# Patient Record
Sex: Female | Born: 1985 | Race: White | Hispanic: No | Smoking: Former smoker
Health system: Southern US, Community
[De-identification: ages and names within clinical notes are randomized; demographics above are authoritative.]

## PROBLEM LIST (undated history)

## (undated) ENCOUNTER — Inpatient Hospital Stay (HOSPITAL_COMMUNITY): Payer: Self-pay

## (undated) DIAGNOSIS — R112 Nausea with vomiting, unspecified: Secondary | ICD-10-CM

## (undated) DIAGNOSIS — Z9889 Other specified postprocedural states: Secondary | ICD-10-CM

## (undated) DIAGNOSIS — R519 Headache, unspecified: Secondary | ICD-10-CM

## (undated) DIAGNOSIS — K9041 Non-celiac gluten sensitivity: Secondary | ICD-10-CM

## (undated) DIAGNOSIS — F32A Depression, unspecified: Secondary | ICD-10-CM

## (undated) DIAGNOSIS — E031 Congenital hypothyroidism without goiter: Secondary | ICD-10-CM

## (undated) HISTORY — PX: CHOLECYSTECTOMY: SHX55

## (undated) HISTORY — PX: ABDOMINAL HERNIA REPAIR: SHX539

---

## 2013-09-03 HISTORY — PX: OOPHORECTOMY: SHX86

## 2013-12-04 NOTE — L&D Delivery Note (Signed)
Delivery Note At 8:45 PM a viable female was delivered via Vaginal, Spontaneous Delivery (Presentation: Left Occiput Anterior).  APGAR: 9, 9; weight .   Placenta status: Intact, Spontaneous.  Cord: 3 vessels with the following complications: 1 loose nuchal which baby was delivered through.  Cord pH: pending  Anesthesia: None  Episiotomy: None Lacerations: None Suture Repair: NA Est. Blood Loss (mL): 200  Mom to postpartum.  Baby to Couplet care / Skin to Skin.  Joanna Puff 08/25/2014, 9:39 PM Evaluation and management procedures were performed by Resident physician under my supervision/collaboration. Chart reviewed, patient examined by me and I agree with management and plan.  Danae Orleans, CNM 08/25/2014 10:26 PM

## 2014-01-23 LAB — OB RESULTS CONSOLE GC/CHLAMYDIA: GC PROBE AMP, GENITAL: NEGATIVE

## 2014-01-23 LAB — OB RESULTS CONSOLE HIV ANTIBODY (ROUTINE TESTING): HIV: NONREACTIVE

## 2014-02-03 LAB — OB RESULTS CONSOLE RPR: RPR: NONREACTIVE

## 2014-02-03 LAB — OB RESULTS CONSOLE HEPATITIS B SURFACE ANTIGEN: HEP B S AG: NEGATIVE

## 2014-02-03 LAB — OB RESULTS CONSOLE ABO/RH: RH Type: NEGATIVE

## 2014-02-03 LAB — OB RESULTS CONSOLE ANTIBODY SCREEN: ANTIBODY SCREEN: NEGATIVE

## 2014-02-03 LAB — OB RESULTS CONSOLE RUBELLA ANTIBODY, IGM: Rubella: IMMUNE

## 2014-06-10 ENCOUNTER — Inpatient Hospital Stay (HOSPITAL_COMMUNITY)
Admission: AD | Admit: 2014-06-10 | Discharge: 2014-06-10 | Disposition: A | Discharge status: Home / Self Care | Payer: Medicaid - Out of State | Source: Ambulatory Visit | Attending: Obstetrics & Gynecology | Admitting: Obstetrics & Gynecology

## 2014-06-10 ENCOUNTER — Encounter (HOSPITAL_COMMUNITY): Payer: Self-pay

## 2014-06-10 ENCOUNTER — Inpatient Hospital Stay (HOSPITAL_COMMUNITY): Payer: Medicaid - Out of State

## 2014-06-10 DIAGNOSIS — Z87891 Personal history of nicotine dependence: Secondary | ICD-10-CM | POA: Diagnosis not present

## 2014-06-10 DIAGNOSIS — O99280 Endocrine, nutritional and metabolic diseases complicating pregnancy, unspecified trimester: Principal | ICD-10-CM

## 2014-06-10 DIAGNOSIS — O26899 Other specified pregnancy related conditions, unspecified trimester: Secondary | ICD-10-CM | POA: Diagnosis present

## 2014-06-10 DIAGNOSIS — Z880 Allergy status to penicillin: Secondary | ICD-10-CM | POA: Diagnosis not present

## 2014-06-10 DIAGNOSIS — O360990 Maternal care for other rhesus isoimmunization, unspecified trimester, not applicable or unspecified: Secondary | ICD-10-CM | POA: Diagnosis not present

## 2014-06-10 DIAGNOSIS — O9989 Other specified diseases and conditions complicating pregnancy, childbirth and the puerperium: Secondary | ICD-10-CM

## 2014-06-10 DIAGNOSIS — E079 Disorder of thyroid, unspecified: Secondary | ICD-10-CM | POA: Insufficient documentation

## 2014-06-10 DIAGNOSIS — E031 Congenital hypothyroidism without goiter: Secondary | ICD-10-CM | POA: Diagnosis not present

## 2014-06-10 DIAGNOSIS — O0933 Supervision of pregnancy with insufficient antenatal care, third trimester: Secondary | ICD-10-CM

## 2014-06-10 DIAGNOSIS — R109 Unspecified abdominal pain: Secondary | ICD-10-CM | POA: Diagnosis present

## 2014-06-10 HISTORY — DX: Congenital hypothyroidism without goiter: E03.1

## 2014-06-10 LAB — URINALYSIS, ROUTINE W REFLEX MICROSCOPIC
Bilirubin Urine: NEGATIVE
GLUCOSE, UA: NEGATIVE mg/dL
Hgb urine dipstick: NEGATIVE
Ketones, ur: NEGATIVE mg/dL
LEUKOCYTES UA: NEGATIVE
NITRITE: NEGATIVE
Protein, ur: NEGATIVE mg/dL
Specific Gravity, Urine: 1.015 (ref 1.005–1.030)
Urobilinogen, UA: 0.2 mg/dL (ref 0.0–1.0)
pH: 7.5 (ref 5.0–8.0)

## 2014-06-10 NOTE — MAU Provider Note (Signed)
None     Chief Complaint:  Abdominal Pain   Paula MoodMorgan Nickell is  28 y.o. G3P2002 at 2210w0d presents complaining of Abdominal Pain .  She states none contractions are associated with none vaginal bleeding, intact membranes, along with active fetal movement. Pt. Presents with ongoing left sided abdominal pain that is intermittent, sharp, and diffusely radiating. She believes the pain may be a hernia. She has a hx of inguinal hernia x2 repaired in 4th grade. She says that she felt a "lump" on the L side of her umbilicus and worsening periodic pain x 6 weeks. She says that the pain is worse when lifting her 7217 month old son, with raising her arms, and periodically whenever she stumbles. She denies nausea, vomiting, fever, or chills. She is passing gas and having BM. She denies dysuria, hematuria, or flank pain. She says that she has not had any trouble urinating, and has had no hematuria. She says that the pain is random and is not worse when she eats. She denies trauma. She has +FM. She denies bleeding, contractions, or LOF.   Obstetrical/Gynecological History: OB History   Grav Para Term Preterm Abortions TAB SAB Ect Mult Living   3 2 2       2      Past Medical History: Past Medical History  Diagnosis Date  . Congenital hypothyroidism     Past Surgical History: Past Surgical History  Procedure Laterality Date  . Abdominal hernia repair    . Oophorectomy Right     Family History: History reviewed. No pertinent family history.  Social History: History  Substance Use Topics  . Smoking status: Former Smoker    Types: Cigarettes  . Smokeless tobacco: Not on file  . Alcohol Use: No    Allergies:  Allergies  Allergen Reactions  . Penicillins Other (See Comments)    hallucinations  . Sulfa Antibiotics Other (See Comments)    hallucinations    Meds:  Prescriptions prior to admission  Medication Sig Dispense Refill  . folic acid (FOLVITE) 400 MCG tablet Take 400 mcg by mouth  daily.      Marland Kitchen. levothyroxine (SYNTHROID, LEVOTHROID) 200 MCG tablet Take 200 mcg by mouth daily before breakfast.      . Pediatric Multiple Vit-C-FA (PEDIATRIC MULTIVITAMIN) chewable tablet Chew 2 tablets by mouth daily.        Review of Systems -  Per HPI above.     Physical Exam  Blood pressure 118/69, pulse 84, temperature 98.2 F (36.8 C), temperature source Oral, resp. rate 16, height 5' 2.5" (1.588 m), weight 61.598 kg (135 lb 12.8 oz), SpO2 99.00%. GENERAL: Well-developed, well-nourished female in no acute distress.  LUNGS: Clear to auscultation bilaterally.  HEART: Regular rate and rhythm. ABDOMEN: Soft, Mildly tender to palpation to LUQ over what appears to be small ventral hernia, nondistended, gravid.  EXTREMITIES: Nontender, no edema, 2+ distal pulses. Neurologically Grossly intact CERVICAL EXAM: Dilatation 0cm   Effacement 0%   Station High   Presentation: cephalic FHT:  Baseline rate 130's bpm   Variability moderate  Accelerations present   Decelerations none Contractions: None   Labs: Results for orders placed during the hospital encounter of 06/10/14 (from the past 24 hour(s))  URINALYSIS, ROUTINE W REFLEX MICROSCOPIC   Collection Time    06/10/14 12:46 PM      Result Value Ref Range   Color, Urine YELLOW  YELLOW   APPearance CLEAR  CLEAR   Specific Gravity, Urine 1.015  1.005 -  1.030   pH 7.5  5.0 - 8.0   Glucose, UA NEGATIVE  NEGATIVE mg/dL   Hgb urine dipstick NEGATIVE  NEGATIVE   Bilirubin Urine NEGATIVE  NEGATIVE   Ketones, ur NEGATIVE  NEGATIVE mg/dL   Protein, ur NEGATIVE  NEGATIVE mg/dL   Urobilinogen, UA 0.2  0.0 - 1.0 mg/dL   Nitrite NEGATIVE  NEGATIVE   Leukocytes, UA NEGATIVE  NEGATIVE   Imaging Studies:  No results found.  Assessment: Paula French is  28 y.o. G3P2002 at 4132w0d complicated by maternal congenital absence of thyroid presents with abdominal pain concerning for abdominal hernia.  Plan: 1. Abdominal pain with concern for  hernia - Abdominal Ultrasound negative for abdominal hernia at this time.  - Able to tolerate po at this time, +BM, +Flatus - Advised minimal lifting <25lb at this time. Avoidance of exacerbating factors.   - [facility]    2. IUP @[redacted]w[redacted]d  - [facility] - Complicated by maternal congenital absence of thyroid currently on synthroid 200mcg qd. Last TSH in [place] WNL per patient.  - Not on PNV at this time as she has run out of her prescription since moving.  - RH Negative. Needs Rhogam. As well as normal prenatal screening.     Melancon, Hillery HunterCaleb G 7/8/20153:21 PM Evaluation and management procedures were performed by Resident physician under my supervision/collaboration. Chart reviewed, patient examined by me and I agree with management and plan.

## 2014-06-10 NOTE — Discharge Instructions (Signed)

## 2014-06-10 NOTE — MAU Note (Signed)
Patient [place]. States [place] but no office will take [facility]. States she has a lump at the umbilical area that has been there for a couple of months but is getting bigger and is having pain. Reports good fetal movement, no bleeding or leaking. Has mild contractions, no pain.

## 2014-06-12 ENCOUNTER — Ambulatory Visit (INDEPENDENT_AMBULATORY_CARE_PROVIDER_SITE_OTHER): Payer: Medicaid Other | Admitting: Obstetrics and Gynecology

## 2014-06-12 ENCOUNTER — Encounter: Payer: Self-pay | Admitting: Obstetrics and Gynecology

## 2014-06-12 VITALS — BP 125/79 | HR 94 | Temp 97.3°F | Wt 137.6 lb

## 2014-06-12 DIAGNOSIS — Z3480 Encounter for supervision of other normal pregnancy, unspecified trimester: Secondary | ICD-10-CM

## 2014-06-12 DIAGNOSIS — O3090 Multiple gestation, unspecified, unspecified trimester: Secondary | ICD-10-CM

## 2014-06-12 DIAGNOSIS — O0930 Supervision of pregnancy with insufficient antenatal care, unspecified trimester: Secondary | ICD-10-CM

## 2014-06-12 DIAGNOSIS — Z3493 Encounter for supervision of normal pregnancy, unspecified, third trimester: Secondary | ICD-10-CM

## 2014-06-12 DIAGNOSIS — O360990 Maternal care for other rhesus isoimmunization, unspecified trimester, not applicable or unspecified: Secondary | ICD-10-CM

## 2014-06-12 DIAGNOSIS — O0933 Supervision of pregnancy with insufficient antenatal care, third trimester: Secondary | ICD-10-CM

## 2014-06-12 DIAGNOSIS — O360131 Maternal care for anti-D [Rh] antibodies, third trimester, fetus 1: Secondary | ICD-10-CM

## 2014-06-12 LAB — POCT URINALYSIS DIP (DEVICE)
Bilirubin Urine: NEGATIVE
GLUCOSE, UA: NEGATIVE mg/dL
Hgb urine dipstick: NEGATIVE
Ketones, ur: NEGATIVE mg/dL
NITRITE: NEGATIVE
PROTEIN: NEGATIVE mg/dL
SPECIFIC GRAVITY, URINE: 1.02 (ref 1.005–1.030)
Urobilinogen, UA: 0.2 mg/dL (ref 0.0–1.0)
pH: 6 (ref 5.0–8.0)

## 2014-06-12 LAB — GLUCOSE TOLERANCE, 1 HOUR (50G) W/O FASTING: GLUCOSE 1 HOUR GTT: 105 mg/dL (ref 70–140)

## 2014-06-12 LAB — CBC
HCT: 35 % — ABNORMAL LOW (ref 36.0–46.0)
Hemoglobin: 12.2 g/dL (ref 12.0–15.0)
MCH: 30.2 pg (ref 26.0–34.0)
MCHC: 34.9 g/dL (ref 30.0–36.0)
MCV: 86.6 fL (ref 78.0–100.0)
Platelets: 156 10*3/uL (ref 150–400)
RBC: 4.04 MIL/uL (ref 3.87–5.11)
RDW: 13 % (ref 11.5–15.5)
WBC: 7.8 10*3/uL (ref 4.0–10.5)

## 2014-06-12 LAB — HIV ANTIBODY (ROUTINE TESTING W REFLEX): HIV: NONREACTIVE

## 2014-06-12 LAB — TSH: TSH: 2.763 u[IU]/mL (ref 0.350–4.500)

## 2014-06-12 LAB — RPR

## 2014-06-12 MED ORDER — CONCEPT OB 130-92.4-1 MG PO CAPS: 1.0000 | ORAL_CAPSULE | Freq: Every day | ORAL | Status: DC

## 2014-06-12 MED ORDER — TETANUS-DIPHTH-ACELL PERTUSSIS 5-2.5-18.5 LF-MCG/0.5 IM SUSP: 0.5000 mL | Freq: Once | INTRAMUSCULAR | Status: DC

## 2014-06-12 MED ORDER — RHO D IMMUNE GLOBULIN 1500 UNIT/2ML IJ SOSY
300.0000 ug | PREFILLED_SYRINGE | Freq: Once | INTRAMUSCULAR | Status: AC
  Administered 2014-06-12: 300 ug via INTRAMUSCULAR

## 2014-06-12 NOTE — Progress Notes (Signed)
Regular PNC in [place] until 23 wks. ROI signed. MAU visit 2 days ago for question of abd wall hernia> US normal. Concerned with frequent loose BMs throughout pregnancy, no blood.  Rhophylactic and 1 hr OGTT 06/12/14. Information on waterbirth given. Discussed LARC. Having 3rd boy.

## 2014-06-12 NOTE — Patient Instructions (Addendum)
Diarrhea Diarrhea is frequent loose and watery bowel movements. It can cause you to feel weak and dehydrated. Dehydration can cause you to become tired and thirsty, have a dry mouth, and have decreased urination that often is dark yellow. Diarrhea is a sign of another problem, most often an infection that will not last long. In most cases, diarrhea typically lasts 2-3 days. However, it can last longer if it is a sign of something more serious. It is important to treat your diarrhea as directed by your caregive to lessen or prevent future episodes of diarrhea. CAUSES  Some common causes include:  Gastrointestinal infections caused by viruses, bacteria, or parasites.  Food poisoning or food allergies.  Certain medicines, such as antibiotics, chemotherapy, and laxatives.  Artificial sweeteners and fructose.  Digestive disorders. HOME CARE INSTRUCTIONS  Ensure adequate fluid intake (hydration): have 1 cup (8 oz) of fluid for each diarrhea episode. Avoid fluids that contain simple sugars or sports drinks, fruit juices, whole milk products, and sodas. Your urine should be clear or pale yellow if you are drinking enough fluids. Hydrate with an oral rehydration solution that you can purchase at pharmacies, retail stores, and online. You can prepare an oral rehydration solution at home by mixing the following ingredients together:   - tsp table salt.   tsp baking soda.   tsp salt substitute containing potassium chloride.  1  tablespoons sugar.  1 L (34 oz) of water.  Certain foods and beverages may increase the speed at which food moves through the gastrointestinal (GI) tract. These foods and beverages should be avoided and include:  Caffeinated and alcoholic beverages.  High-fiber foods, such as raw fruits and vegetables, nuts, seeds, and whole grain breads and cereals.  Foods and beverages sweetened with sugar alcohols, such as xylitol, sorbitol, and mannitol.  Some foods may be well  tolerated and may help thicken stool including:  Starchy foods, such as rice, toast, pasta, low-sugar cereal, oatmeal, grits, baked potatoes, crackers, and bagels.  Bananas.  Applesauce.  Add probiotic-rich foods to help increase healthy bacteria in the GI tract, such as yogurt and fermented milk products.  Wash your hands well after each diarrhea episode.  Only take over-the-counter or prescription medicines as directed by your caregiver.  Take a warm bath to relieve any burning or pain from frequent diarrhea episodes. SEEK IMMEDIATE MEDICAL CARE IF:   You are unable to keep fluids down.  You have persistent vomiting.  You have blood in your stool, or your stools are black and tarry.  You do not urinate in 6-8 hours, or there is only a small amount of very dark urine.  You have abdominal pain that increases or localizes.  You have weakness, dizziness, confusion, or lightheadedness.  You have a severe headache.  Your diarrhea gets worse or does not get better.  You have a fever or persistent symptoms for more than 2-3 days.  You have a fever and your symptoms suddenly get worse. MAKE SURE YOU:   Understand these instructions.  Will watch your condition.  Will get help right away if you are not doing well or get worse. Document Released: 11/10/2002 Document Revised: 11/06/2012 Document Reviewed: 07/28/2012 [facility] Patient Information 2015 [place], [place]. This information is not intended to replace advice given to you by your health care provider. Make sure you discuss any questions you have with your health care provider. Contraception Choices Contraception (birth control) is the use of any methods or devices to prevent pregnancy. Below  are some methods to help avoid pregnancy. HORMONAL METHODS   Contraceptive implant. This is a thin, plastic tube containing progesterone hormone. It does not contain estrogen hormone. Your health care provider inserts the tube in the  inner part of the upper arm. The tube can remain in place for up to 3 years. After 3 years, the implant must be removed. The implant prevents the ovaries from releasing an egg (ovulation), thickens the cervical mucus to prevent sperm from entering the uterus, and thins the lining of the inside of the uterus.  Progesterone-only injections. These injections are given every 3 months by your health care provider to prevent pregnancy. This synthetic progesterone hormone stops the ovaries from releasing eggs. It also thickens cervical mucus and changes the uterine lining. This makes it harder for sperm to survive in the uterus.  Birth control pills. These pills contain estrogen and progesterone hormone. They work by preventing the ovaries from releasing eggs (ovulation). They also cause the cervical mucus to thicken, preventing the sperm from entering the uterus. Birth control pills are prescribed by a health care provider.Birth control pills can also be used to treat heavy periods.  Minipill. This type of birth control pill contains only the progesterone hormone. They are taken every day of each month and must be prescribed by your health care provider.  Birth control patch. The patch contains hormones similar to those in birth control pills. It must be changed once a week and is prescribed by a health care provider.  Vaginal ring. The ring contains hormones similar to those in birth control pills. It is left in the vagina for 3 weeks, removed for 1 week, and then a new one is put back in place. The patient must be comfortable inserting and removing the ring from the vagina.A health care provider's prescription is necessary.  Emergency contraception. Emergency contraceptives prevent pregnancy after unprotected sexual intercourse. This pill can be taken right after sex or up to 5 days after unprotected sex. It is most effective the sooner you take the pills after having sexual intercourse. Most emergency  contraceptive pills are available without a prescription. Check with your pharmacist. Do not use emergency contraception as your only form of birth control. BARRIER METHODS   Female condom. This is a thin sheath (latex or rubber) that is worn over the penis during sexual intercourse. It can be used with spermicide to increase effectiveness.  Female condom. This is a soft, loose-fitting sheath that is put into the vagina before sexual intercourse.  Diaphragm. This is a soft, latex, dome-shaped barrier that must be fitted by a health care provider. It is inserted into the vagina, along with a spermicidal jelly. It is inserted before intercourse. The diaphragm should be left in the vagina for 6 to 8 hours after intercourse.  Cervical cap. This is a round, soft, latex or plastic cup that fits over the cervix and must be fitted by a health care provider. The cap can be left in place for up to 48 hours after intercourse.  Sponge. This is a soft, circular piece of polyurethane foam. The sponge has spermicide in it. It is inserted into the vagina after wetting it and before sexual intercourse.  Spermicides. These are chemicals that kill or block sperm from entering the cervix and uterus. They come in the form of creams, jellies, suppositories, foam, or tablets. They do not require a prescription. They are inserted into the vagina with an applicator before having sexual intercourse. The process  must be repeated every time you have sexual intercourse. INTRAUTERINE CONTRACEPTION  Intrauterine device (IUD). This is a T-shaped device that is put in a woman's uterus during a menstrual period to prevent pregnancy. There are 2 types:  Copper IUD. This type of IUD is wrapped in copper wire and is placed inside the uterus. Copper makes the uterus and fallopian tubes produce a fluid that kills sperm. It can stay in place for 10 years.  Hormone IUD. This type of IUD contains the hormone progestin (synthetic  progesterone). The hormone thickens the cervical mucus and prevents sperm from entering the uterus, and it also thins the uterine lining to prevent implantation of a fertilized egg. The hormone can weaken or kill the sperm that get into the uterus. It can stay in place for 3-5 years, depending on which type of IUD is used. PERMANENT METHODS OF CONTRACEPTION  Female tubal ligation. This is when the woman's fallopian tubes are surgically sealed, tied, or blocked to prevent the egg from traveling to the uterus.  Hysteroscopic sterilization. This involves placing a small coil or insert into each fallopian tube. Your doctor uses a technique called hysteroscopy to do the procedure. The device causes scar tissue to form. This results in permanent blockage of the fallopian tubes, so the sperm cannot fertilize the egg. It takes about 3 months after the procedure for the tubes to become blocked. You must use another form of birth control for these 3 months.  Female sterilization. This is when the female has the tubes that carry sperm tied off (vasectomy).This blocks sperm from entering the vagina during sexual intercourse. After the procedure, the man can still ejaculate fluid (semen). NATURAL PLANNING METHODS  Natural family planning. This is not having sexual intercourse or using a barrier method (condom, diaphragm, cervical cap) on days the woman could become pregnant.  Calendar method. This is keeping track of the length of each menstrual cycle and identifying when you are fertile.  Ovulation method. This is avoiding sexual intercourse during ovulation.  Symptothermal method. This is avoiding sexual intercourse during ovulation, using a thermometer and ovulation symptoms.  Post-ovulation method. This is timing sexual intercourse after you have ovulated. Regardless of which type or method of contraception you choose, it is important that you use condoms to protect against the transmission of sexually  transmitted infections (STIs). Talk with your health care provider about which form of contraception is most appropriate for you. Document Released: 11/20/2005 Document Revised: 11/25/2013 Document Reviewed: 05/15/2013 [facility] Patient Information 2015 [place]. This information is not intended to replace advice given to you by your health care provider. Make sure you discuss any questions you have with your health care provider.

## 2014-06-12 NOTE — Progress Notes (Signed)
28 week labs today Given tdap info pt will think about it  Given new ob packet and 28 week packet  Interested in water birth

## 2014-06-12 NOTE — MAU Provider Note (Signed)
Attestation of Attending Supervision of Advanced Practitioner (PA/CNM/NP): Evaluation and management procedures were performed by the Advanced Practitioner under my supervision and collaboration.  I have reviewed the Advanced Practitioner's note and chart, and I agree with the management and plan.  Reva BoresPRATT,[name], MD [facility] Attending 06/12/2014 8:20 AM

## 2014-06-17 ENCOUNTER — Encounter: Payer: Self-pay | Admitting: *Deleted

## 2014-06-30 ENCOUNTER — Encounter: Payer: Self-pay | Admitting: Obstetrics & Gynecology

## 2014-06-30 ENCOUNTER — Ambulatory Visit (INDEPENDENT_AMBULATORY_CARE_PROVIDER_SITE_OTHER): Payer: Medicaid - Out of State | Admitting: Obstetrics & Gynecology

## 2014-06-30 VITALS — BP 112/66 | HR 76 | Temp 97.6°F | Wt 141.2 lb

## 2014-06-30 DIAGNOSIS — O0933 Supervision of pregnancy with insufficient antenatal care, third trimester: Secondary | ICD-10-CM

## 2014-06-30 DIAGNOSIS — O0930 Supervision of pregnancy with insufficient antenatal care, unspecified trimester: Secondary | ICD-10-CM

## 2014-06-30 LAB — POCT URINALYSIS DIP (DEVICE)
Bilirubin Urine: NEGATIVE
Glucose, UA: NEGATIVE mg/dL
Hgb urine dipstick: NEGATIVE
KETONES UR: NEGATIVE mg/dL
Nitrite: NEGATIVE
PH: 6.5 (ref 5.0–8.0)
PROTEIN: NEGATIVE mg/dL
Specific Gravity, Urine: 1.015 (ref 1.005–1.030)
UROBILINOGEN UA: 0.2 mg/dL (ref 0.0–1.0)

## 2014-06-30 NOTE — Progress Notes (Signed)
Routine visit. Good FM "He's crazy". No problems.

## 2014-07-06 ENCOUNTER — Telehealth: Payer: Self-pay | Admitting: *Deleted

## 2014-07-06 NOTE — Telephone Encounter (Signed)
Contacted patient, answered questions concerning labs, appointment schedule and who to call if she has concerns after hours.  Information given/pt verbalizes understanding.

## 2014-07-08 ENCOUNTER — Encounter (HOSPITAL_COMMUNITY): Payer: Self-pay

## 2014-07-08 ENCOUNTER — Inpatient Hospital Stay (HOSPITAL_COMMUNITY)
Admission: AD | Admit: 2014-07-08 | Discharge: 2014-07-09 | Disposition: A | Discharge status: Home / Self Care | Payer: Medicaid Other | Source: Ambulatory Visit | Attending: Obstetrics & Gynecology | Admitting: Obstetrics & Gynecology

## 2014-07-08 DIAGNOSIS — O4700 False labor before 37 completed weeks of gestation, unspecified trimester: Secondary | ICD-10-CM | POA: Insufficient documentation

## 2014-07-08 DIAGNOSIS — F1210 Cannabis abuse, uncomplicated: Secondary | ICD-10-CM | POA: Diagnosis not present

## 2014-07-08 DIAGNOSIS — O99340 Other mental disorders complicating pregnancy, unspecified trimester: Secondary | ICD-10-CM | POA: Diagnosis not present

## 2014-07-08 DIAGNOSIS — O479 False labor, unspecified: Secondary | ICD-10-CM

## 2014-07-08 DIAGNOSIS — Z880 Allergy status to penicillin: Secondary | ICD-10-CM | POA: Diagnosis not present

## 2014-07-08 DIAGNOSIS — Z87891 Personal history of nicotine dependence: Secondary | ICD-10-CM | POA: Insufficient documentation

## 2014-07-08 HISTORY — DX: Nausea with vomiting, unspecified: R11.2

## 2014-07-08 HISTORY — DX: Other specified postprocedural states: Z98.890

## 2014-07-08 LAB — URINALYSIS, ROUTINE W REFLEX MICROSCOPIC
BILIRUBIN URINE: NEGATIVE
Glucose, UA: NEGATIVE mg/dL
Hgb urine dipstick: NEGATIVE
KETONES UR: 15 mg/dL — AB
LEUKOCYTES UA: NEGATIVE
Nitrite: NEGATIVE
PH: 6 (ref 5.0–8.0)
PROTEIN: NEGATIVE mg/dL
Specific Gravity, Urine: <1.005 — ABNORMAL LOW (ref 1.005–1.030)
Urobilinogen, UA: 0.2 mg/dL (ref 0.0–1.0)

## 2014-07-08 MED ORDER — NIFEDIPINE 10 MG PO CAPS
20.0000 mg | ORAL_CAPSULE | Freq: Three times a day (TID) | ORAL | Status: DC
  Administered 2014-07-09: 20 mg via ORAL
  Filled 2014-07-08: qty 2

## 2014-07-08 NOTE — MAU Provider Note (Signed)
History     CSN: [id number]  Arrival date and time: 07/08/14 2213   None     Chief Complaint  Patient presents with  . Contractions   HPI  28 year old G3P2002 at [redacted]w[redacted]d gestation who presents with complaints of contractions. Patient noticed contractions at 1300 hours. Mild in intensity, worse with ambulation, irregular. She had a sensation of pressure in her pelvis that happened at about the same time her contractions began. No vaginal bleeding/discharge/leakage of fluid. Notes normal fetal movement. No recent trauma/sexual intercourse/cocaine use. Admits to some marijuana use on occasion for stress relief. No history of preterm labor. No other complaints today.    Past Medical History  Diagnosis Date  . Congenital hypothyroidism   . PONV (postoperative nausea and vomiting)     Past Surgical History  Procedure Laterality Date  . Abdominal hernia repair  1993, 1997  . Oophorectomy Right 09/2013    ovarian cysts    History reviewed. No pertinent family history.  History  Substance Use Topics  . Smoking status: Former Smoker    Types: Cigarettes  . Smokeless tobacco: Never Used  . Alcohol Use: No    Allergies:  Allergies  Allergen Reactions  . Penicillins Other (See Comments)    hallucinations  . Sulfa Antibiotics Other (See Comments)    hallucinations    Prescriptions prior to admission  Medication Sig Dispense Refill  . levothyroxine (SYNTHROID, LEVOTHROID) 200 MCG tablet Take 200 mcg by mouth daily before breakfast.      . Prenat w/o A Vit-FeFum-FePo-FA (CONCEPT OB) 130-92.4-1 MG CAPS Take 1 capsule by mouth daily.  30 capsule  11    Review of Systems  Constitutional: Negative for fever and chills.  HENT: Negative for hearing loss.   Eyes: Negative for blurred vision and double vision.  Respiratory: Negative for cough and shortness of breath.   Cardiovascular: Negative for chest pain and palpitations.  Gastrointestinal: Positive for diarrhea. Negative for  heartburn, nausea, vomiting, abdominal pain and constipation.  Genitourinary: Negative for dysuria and urgency.  Musculoskeletal: Negative for myalgias and neck pain.  Skin: Negative for itching and rash.  Neurological: Negative for dizziness, tingling and headaches.  Psychiatric/Behavioral: The patient is nervous/anxious.    Physical Exam   Blood pressure 112/72, pulse 82, temperature 98.1 F (36.7 C), temperature source Oral, resp. rate 18, height 5' 2.5" (1.588 m), weight 64.32 kg (141 lb 12.8 oz), not currently breastfeeding.  Physical Exam  Constitutional: She is oriented to person, place, and time. She appears well-developed and well-nourished.  28 year old female appears very comfortable  Cardiovascular: Normal rate and regular rhythm.   Respiratory: Effort normal and breath sounds normal.  GI: Soft. Bowel sounds are normal.  Genitourinary:  Cervix fingertip, thick, high, posterior  Musculoskeletal: She exhibits no edema and no tenderness.  Neurological: She is alert and oriented to person, place, and time.  Skin: Skin is warm and dry.    MAU Course  Procedures  MDM Cervical exam shows closed cervix. Toco. Give dose of nifedipine to calm contractions.  Assessment and Plan  No signs of active labor. 2 contractions noted on monitor in 40 minutes. Good response to procardia. FHR reassuring. Discussed that fetus is now vertex and may account for new feeling of pressure. No other warning signs of imminent delivery. Safe to go home for now. Return if contractions worsen or become more regular. Encouraged pregnancy support belt.  Followup with prenatal provider within one week.  Patient evaluated  with Francene FindersKimberly Shaw  Stephens, Devin A 07/08/2014, 11:55 PM   I have seen and examined this patient and I agree with the above. Cam HaiSHAW, KIMBERLY CNM 3:46 AM 07/09/2014

## 2014-07-08 NOTE — MAU Note (Signed)
Contractions today. Denies LOF or vaginal bleeding.increased mucous discharge x 2 days. Positive fetal movement, decreased a little today.

## 2014-07-09 DIAGNOSIS — O479 False labor, unspecified: Secondary | ICD-10-CM

## 2014-07-09 DIAGNOSIS — O4700 False labor before 37 completed weeks of gestation, unspecified trimester: Secondary | ICD-10-CM | POA: Diagnosis not present

## 2014-07-09 NOTE — Discharge Instructions (Signed)
Braxton Hicks Contractions Contractions of the uterus can occur throughout pregnancy. Contractions are not always a sign that you are in labor.  WHAT ARE BRAXTON HICKS CONTRACTIONS?  Contractions that occur before labor are called Braxton Hicks contractions, or false labor. Toward the end of pregnancy (32-34 weeks), these contractions can develop more often and may become more forceful. This is not true labor because these contractions do not result in opening (dilatation) and thinning of the cervix. They are sometimes difficult to tell apart from true labor because these contractions can be forceful and people have different pain tolerances. You should not feel embarrassed if you go to the hospital with false labor. Sometimes, the only way to tell if you are in true labor is for your health care provider to look for changes in the cervix. If there are no prenatal problems or other health problems associated with the pregnancy, it is completely safe to be sent home with false labor and await the onset of true labor. HOW CAN YOU TELL THE DIFFERENCE BETWEEN TRUE AND FALSE LABOR? False Labor  The contractions of false labor are usually shorter and not as hard as those of true labor.   The contractions are usually irregular.   The contractions are often felt in the front of the lower abdomen and in the groin.   The contractions may go away when you walk around or change positions while lying down.   The contractions get weaker and are shorter lasting as time goes on.   The contractions do not usually become progressively stronger, regular, and closer together as with true labor.  True Labor  Contractions in true labor last 30-70 seconds, become very regular, usually become more intense, and increase in frequency.   The contractions do not go away with walking.   The discomfort is usually felt in the top of the uterus and spreads to the lower abdomen and low back.   True labor can be  determined by your health care provider with an exam. This will show that the cervix is dilating and getting thinner.  WHAT TO REMEMBER  Keep up with your usual exercises and follow other instructions given by your health care provider.   Take medicines as directed by your health care provider.   Keep your regular prenatal appointments.   Eat and drink lightly if you think you are going into labor.   If Braxton Hicks contractions are making you uncomfortable:   Change your position from lying down or resting to walking, or from walking to resting.   Sit and rest in a tub of warm water.   Drink 2-3 glasses of water. Dehydration may cause these contractions.   Do slow and deep breathing several times an hour.  WHEN SHOULD I SEEK IMMEDIATE MEDICAL CARE? Seek immediate medical care if:  Your contractions become stronger, more regular, and closer together.   You have fluid leaking or gushing from your vagina.   You have a fever.   You pass blood-tinged mucus.   You have vaginal bleeding.   You have continuous abdominal pain.   You have low back pain that you never had before.   You feel your baby's head pushing down and causing pelvic pressure.   Your baby is not moving as much as it used to.  Document Released: 11/20/2005 Document Revised: 11/25/2013 Document Reviewed: 09/01/2013 Exit[employer] Patient Information 2015 [employer]. This information is not intended to replace advice given to you by your health care   provider. Make sure you discuss any questions you have with your health care provider.  Fetal Movement Counts Patient Name: __________________________________________________ Patient Due Date: ____________________ Performing a fetal movement count is highly recommended in high-risk pregnancies, but it is good for every pregnant woman to do. Your health care provider may ask you to start counting fetal movements at 28 weeks of the pregnancy. Fetal  movements often increase:  After eating a full meal.  After physical activity.  After eating or drinking something sweet or cold.  At rest. Pay attention to when you feel the baby is most active. This will help you notice a pattern of your baby's sleep and wake cycles and what factors contribute to an increase in fetal movement. It is important to perform a fetal movement count at the same time each day when your baby is normally most active.  HOW TO COUNT FETAL MOVEMENTS 1. Find a quiet and comfortable area to sit or lie down on your left side. Lying on your left side provides the best blood and oxygen circulation to your baby. 2. Write down the day and time on a sheet of paper or in a journal. 3. Start counting kicks, flutters, swishes, rolls, or jabs in a 2-hour period. You should feel at least 10 movements within 2 hours. 4. If you do not feel 10 movements in 2 hours, wait 2-3 hours and count again. Look for a change in the pattern or not enough counts in 2 hours. SEEK MEDICAL CARE IF:  You feel less than 10 counts in 2 hours, tried twice.  There is no movement in over an hour.  The pattern is changing or taking longer each day to reach 10 counts in 2 hours.  You feel the baby is not moving as he or she usually does. Date: ____________ Movements: ____________ Start time: ____________ Finish time: ____________  Date: ____________ Movements: ____________ Start time: ____________ Finish time: ____________ Date: ____________ Movements: ____________ Start time: ____________ Finish time: ____________ Date: ____________ Movements: ____________ Start time: ____________ Finish time: ____________ Date: ____________ Movements: ____________ Start time: ____________ Finish time: ____________ Date: ____________ Movements: ____________ Start time: ____________ Finish time: ____________ Date: ____________ Movements: ____________ Start time: ____________ Finish time: ____________ Date: ____________  Movements: ____________ Start time: ____________ Finish time: ____________  Date: ____________ Movements: ____________ Start time: ____________ Finish time: ____________ Date: ____________ Movements: ____________ Start time: ____________ Finish time: ____________ Date: ____________ Movements: ____________ Start time: ____________ Finish time: ____________ Date: ____________ Movements: ____________ Start time: ____________ Finish time: ____________ Date: ____________ Movements: ____________ Start time: ____________ Finish time: ____________ Date: ____________ Movements: ____________ Start time: ____________ Finish time: ____________ Date: ____________ Movements: ____________ Start time: ____________ Finish time: ____________  Date: ____________ Movements: ____________ Start time: ____________ Finish time: ____________ Date: ____________ Movements: ____________ Start time: ____________ Finish time: ____________ Date: ____________ Movements: ____________ Start time: ____________ Finish time: ____________ Date: ____________ Movements: ____________ Start time: ____________ Finish time: ____________ Date: ____________ Movements: ____________ Start time: ____________ Finish time: ____________ Date: ____________ Movements: ____________ Start time: ____________ Finish time: ____________ Date: ____________ Movements: ____________ Start time: ____________ Finish time: ____________  Date: ____________ Movements: ____________ Start time: ____________ Finish time: ____________ Date: ____________ Movements: ____________ Start time: ____________ Finish time: ____________ Date: ____________ Movements: ____________ Start time: ____________ Finish time: ____________ Date: ____________ Movements: ____________ Start time: ____________ Finish time: ____________ Date: ____________ Movements: ____________ Start time: ____________ Finish time: ____________ Date: ____________ Movements: ____________ Start time:  ____________ Finish time: ____________ Date: ____________ Movements:   ____________ Start time: ____________ Finish time: ____________  Date: ____________ Movements: ____________ Start time: ____________ Finish time: ____________ Date: ____________ Movements: ____________ Start time: ____________ Finish time: ____________ Date: ____________ Movements: ____________ Start time: ____________ Finish time: ____________ Date: ____________ Movements: ____________ Start time: ____________ Finish time: ____________ Date: ____________ Movements: ____________ Start time: ____________ Finish time: ____________ Date: ____________ Movements: ____________ Start time: ____________ Finish time: ____________ Date: ____________ Movements: ____________ Start time: ____________ Finish time: ____________  Date: ____________ Movements: ____________ Start time: ____________ Finish time: ____________ Date: ____________ Movements: ____________ Start time: ____________ Finish time: ____________ Date: ____________ Movements: ____________ Start time: ____________ Finish time: ____________ Date: ____________ Movements: ____________ Start time: ____________ Finish time: ____________ Date: ____________ Movements: ____________ Start time: ____________ Finish time: ____________ Date: ____________ Movements: ____________ Start time: ____________ Finish time: ____________ Date: ____________ Movements: ____________ Start time: ____________ Finish time: ____________  Date: ____________ Movements: ____________ Start time: ____________ Finish time: ____________ Date: ____________ Movements: ____________ Start time: ____________ Finish time: ____________ Date: ____________ Movements: ____________ Start time: ____________ Finish time: ____________ Date: ____________ Movements: ____________ Start time: ____________ Finish time: ____________ Date: ____________ Movements: ____________ Start time: ____________ Finish time: ____________ Date:  ____________ Movements: ____________ Start time: ____________ Finish time: ____________ Date: ____________ Movements: ____________ Start time: ____________ Finish time: ____________  Date: ____________ Movements: ____________ Start time: ____________ Finish time: ____________ Date: ____________ Movements: ____________ Start time: ____________ Finish time: ____________ Date: ____________ Movements: ____________ Start time: ____________ Finish time: ____________ Date: ____________ Movements: ____________ Start time: ____________ Finish time: ____________ Date: ____________ Movements: ____________ Start time: ____________ Finish time: ____________ Date: ____________ Movements: ____________ Start time: ____________ Finish time: ____________ Document Released: 12/20/2006 Document Revised: 04/06/2014 Document Reviewed: 09/16/2012 [employer] Patient Information 2015 [employer]. This information is not intended to replace advice given to you by your health care provider. Make sure you discuss any questions you have with your health care provider.  

## 2014-07-21 ENCOUNTER — Ambulatory Visit (INDEPENDENT_AMBULATORY_CARE_PROVIDER_SITE_OTHER): Payer: Medicaid Other | Admitting: Advanced Practice Midwife

## 2014-07-21 VITALS — BP 112/70 | HR 78 | Temp 98.3°F | Wt 142.2 lb

## 2014-07-21 DIAGNOSIS — O26899 Other specified pregnancy related conditions, unspecified trimester: Secondary | ICD-10-CM | POA: Insufficient documentation

## 2014-07-21 DIAGNOSIS — Z9079 Acquired absence of other genital organ(s): Secondary | ICD-10-CM

## 2014-07-21 DIAGNOSIS — O360990 Maternal care for other rhesus isoimmunization, unspecified trimester, not applicable or unspecified: Secondary | ICD-10-CM

## 2014-07-21 DIAGNOSIS — Z6791 Unspecified blood type, Rh negative: Secondary | ICD-10-CM | POA: Insufficient documentation

## 2014-07-21 DIAGNOSIS — Z90721 Acquired absence of ovaries, unilateral: Secondary | ICD-10-CM | POA: Insufficient documentation

## 2014-07-21 DIAGNOSIS — E031 Congenital hypothyroidism without goiter: Secondary | ICD-10-CM | POA: Insufficient documentation

## 2014-07-21 DIAGNOSIS — O3090 Multiple gestation, unspecified, unspecified trimester: Secondary | ICD-10-CM

## 2014-07-21 DIAGNOSIS — O360131 Maternal care for anti-D [Rh] antibodies, third trimester, fetus 1: Secondary | ICD-10-CM

## 2014-07-21 LAB — T4, FREE: Free T4: 1.29 ng/dL (ref 0.80–1.80)

## 2014-07-21 LAB — POCT URINALYSIS DIP (DEVICE)
BILIRUBIN URINE: NEGATIVE
Glucose, UA: NEGATIVE mg/dL
Hgb urine dipstick: NEGATIVE
Ketones, ur: NEGATIVE mg/dL
NITRITE: NEGATIVE
PH: 7 (ref 5.0–8.0)
Protein, ur: NEGATIVE mg/dL
Specific Gravity, Urine: 1.015 (ref 1.005–1.030)
Urobilinogen, UA: 0.2 mg/dL (ref 0.0–1.0)

## 2014-07-21 LAB — TSH: TSH: 2.944 u[IU]/mL (ref 0.350–4.500)

## 2014-07-21 LAB — T3: T3, Total: 175.8 ng/dL (ref 80.0–204.0)

## 2014-07-21 NOTE — Progress Notes (Signed)
C/o of intermittent pelvic and rectal pressure-- intermittent contractions-- reports no regularity but c/o of feeling "crampy" with lower back pain-- pt. Requests for cervix to be checked.

## 2014-07-21 NOTE — Patient Instructions (Signed)
Third Trimester of Pregnancy The third trimester is from week 29 through week 42, months 7 through 9. The third trimester is a time when the fetus is growing rapidly. At the end of the ninth month, the fetus is about 20 inches in length and weighs 6-10 pounds.  BODY CHANGES Your body goes through many changes during pregnancy. The changes vary from woman to woman.   Your weight will continue to increase. You can expect to gain 25-35 pounds (11-16 kg) by the end of the pregnancy.  You may begin to get stretch marks on your hips, abdomen, and breasts.  You may urinate more often because the fetus is moving lower into your pelvis and pressing on your bladder.  You may develop or continue to have heartburn as a result of your pregnancy.  You may develop constipation because certain hormones are causing the muscles that push waste through your intestines to slow down.  You may develop hemorrhoids or swollen, bulging veins (varicose veins).  You may have pelvic pain because of the weight gain and pregnancy hormones relaxing your joints between the bones in your pelvis. Backaches may result from overexertion of the muscles supporting your posture.  You may have changes in your hair. These can include thickening of your hair, rapid growth, and changes in texture. Some women also have hair loss during or after pregnancy, or hair that feels dry or thin. Your hair will most likely return to normal after your baby is born.  Your breasts will continue to grow and be tender. A yellow discharge may leak from your breasts called colostrum.  Your belly button may stick out.  You may feel short of breath because of your expanding uterus.  You may notice the fetus "dropping," or moving lower in your abdomen.  You may have a bloody mucus discharge. This usually occurs a few days to a week before labor begins.  Your cervix becomes thin and soft (effaced) near your due date. WHAT TO EXPECT AT YOUR PRENATAL  EXAMS  You will have prenatal exams every 2 weeks until week 36. Then, you will have weekly prenatal exams. During a routine prenatal visit:  You will be weighed to make sure you and the fetus are growing normally.  Your blood pressure is taken.  Your abdomen will be measured to track your baby's growth.  The fetal heartbeat will be listened to.  Any test results from the previous visit will be discussed.  You may have a cervical check near your due date to see if you have effaced. At around 36 weeks, your caregiver will check your cervix. At the same time, your caregiver will also perform a test on the secretions of the vaginal tissue. This test is to determine if a type of bacteria, Group B streptococcus, is present. Your caregiver will explain this further. Your caregiver may ask you:  What your birth plan is.  How you are feeling.  If you are feeling the baby move.  If you have had any abnormal symptoms, such as leaking fluid, bleeding, severe headaches, or abdominal cramping.  If you have any questions. Other tests or screenings that may be performed during your third trimester include:  Blood tests that check for low iron levels (anemia).  Fetal testing to check the health, activity level, and growth of the fetus. Testing is done if you have certain medical conditions or if there are problems during the pregnancy. FALSE LABOR You may feel small, irregular contractions that   eventually go away. These are called Braxton Hicks contractions, or false labor. Contractions may last for hours, days, or even weeks before true labor sets in. If contractions come at regular intervals, intensify, or become painful, it is best to be seen by your caregiver.  SIGNS OF LABOR   Menstrual-like cramps.  Contractions that are 5 minutes apart or less.  Contractions that start on the top of the uterus and spread down to the lower abdomen and back.  A sense of increased pelvic pressure or back  pain.  A watery or bloody mucus discharge that comes from the vagina. If you have any of these signs before the 37th week of pregnancy, call your caregiver right away. You need to go to the hospital to get checked immediately. HOME CARE INSTRUCTIONS   Avoid all smoking, herbs, alcohol, and unprescribed drugs. These chemicals affect the formation and growth of the baby.  Follow your caregiver's instructions regarding medicine use. There are medicines that are either safe or unsafe to take during pregnancy.  Exercise only as directed by your caregiver. Experiencing uterine cramps is a good sign to stop exercising.  Continue to eat regular, healthy meals.  Wear a good support bra for breast tenderness.  Do not use hot tubs, steam rooms, or saunas.  Wear your seat belt at all times when driving.  Avoid raw meat, uncooked cheese, cat litter boxes, and soil used by cats. These carry germs that can cause birth defects in the baby.  Take your prenatal vitamins.  Try taking a stool softener (if your caregiver approves) if you develop constipation. Eat more high-fiber foods, such as fresh vegetables or fruit and whole grains. Drink plenty of fluids to keep your urine clear or pale yellow.  Take warm sitz baths to soothe any pain or discomfort caused by hemorrhoids. Use hemorrhoid cream if your caregiver approves.  If you develop varicose veins, wear support hose. Elevate your feet for 15 minutes, 3-4 times a day. Limit salt in your diet.  Avoid heavy lifting, wear low heal shoes, and practice good posture.  Rest a lot with your legs elevated if you have leg cramps or low back pain.  Visit your dentist if you have not gone during your pregnancy. Use a soft toothbrush to brush your teeth and be gentle when you floss.  A sexual relationship may be continued unless your caregiver directs you otherwise.  Do not travel far distances unless it is absolutely necessary and only with the approval  of your caregiver.  Take prenatal classes to understand, practice, and ask questions about the labor and delivery.  Make a trial run to the hospital.  Pack your hospital bag.  Prepare the baby's nursery.  Continue to go to all your prenatal visits as directed by your caregiver. SEEK MEDICAL CARE IF:  You are unsure if you are in labor or if your water has broken.  You have dizziness.  You have mild pelvic cramps, pelvic pressure, or nagging pain in your abdominal area.  You have persistent nausea, vomiting, or diarrhea.  You have a bad smelling vaginal discharge.  You have pain with urination. SEEK IMMEDIATE MEDICAL CARE IF:   You have a fever.  You are leaking fluid from your vagina.  You have spotting or bleeding from your vagina.  You have severe abdominal cramping or pain.  You have rapid weight loss or gain.  You have shortness of breath with chest pain.  You notice sudden or extreme swelling   of your face, hands, ankles, feet, or legs.  You have not felt your baby move in over an hour.  You have severe headaches that do not go away with medicine.  You have vision changes. Document Released: 11/14/2001 Document Revised: 11/25/2013 Document Reviewed: 01/21/2013 [employer] Patient Information 2015 [employer]. This information is not intended to replace advice given to you by your health care provider. Make sure you discuss any questions you have with your health care provider.  

## 2014-07-21 NOTE — Progress Notes (Signed)
Doing well, but having a lot of pelvic pressure. Reviewed congenital hypothyroidism. Does not have an endocrinologist here. Anne [facility]Saw [facility]am Med MD there. Will check third trimester TSH, T3, free T4 today and refer to Endocrinologist. On Synthroid 200mcg

## 2014-07-22 ENCOUNTER — Encounter: Payer: Self-pay | Admitting: Advanced Practice Midwife

## 2014-07-23 ENCOUNTER — Encounter: Payer: Self-pay | Admitting: *Deleted

## 2014-07-23 ENCOUNTER — Telehealth: Payer: Self-pay | Admitting: *Deleted

## 2014-07-23 NOTE — Telephone Encounter (Signed)
Attempted to contact patient, no answer, left message for patient that we have scheduled her an appointment for September 4 @ 1400 with Dr. Lucianne MussKumar for Endocrinology.  Address/phone number given.  Will send letter.   Letter sent.

## 2014-07-27 ENCOUNTER — Telehealth: Payer: Self-pay | Admitting: *Deleted

## 2014-07-27 NOTE — Telephone Encounter (Signed)
Patient called requesting her lab results from last week.

## 2014-07-27 NOTE — Telephone Encounter (Signed)
Spoke with patient she stated that she has already seen her results on mychart and had no further questions.

## 2014-08-05 ENCOUNTER — Ambulatory Visit (INDEPENDENT_AMBULATORY_CARE_PROVIDER_SITE_OTHER): Payer: Medicaid Other | Admitting: Advanced Practice Midwife

## 2014-08-05 ENCOUNTER — Other Ambulatory Visit: Payer: Self-pay | Admitting: Advanced Practice Midwife

## 2014-08-05 ENCOUNTER — Encounter: Payer: Self-pay | Admitting: Advanced Practice Midwife

## 2014-08-05 VITALS — BP 118/78 | HR 75 | Temp 98.2°F | Wt 144.5 lb

## 2014-08-05 DIAGNOSIS — E031 Congenital hypothyroidism without goiter: Secondary | ICD-10-CM

## 2014-08-05 DIAGNOSIS — L293 Anogenital pruritus, unspecified: Secondary | ICD-10-CM

## 2014-08-05 DIAGNOSIS — O0930 Supervision of pregnancy with insufficient antenatal care, unspecified trimester: Secondary | ICD-10-CM

## 2014-08-05 DIAGNOSIS — O360990 Maternal care for other rhesus isoimmunization, unspecified trimester, not applicable or unspecified: Secondary | ICD-10-CM

## 2014-08-05 DIAGNOSIS — N898 Other specified noninflammatory disorders of vagina: Secondary | ICD-10-CM

## 2014-08-05 LAB — POCT URINALYSIS DIP (DEVICE)
Bilirubin Urine: NEGATIVE
Glucose, UA: NEGATIVE mg/dL
Hgb urine dipstick: NEGATIVE
Ketones, ur: 15 mg/dL — AB
Leukocytes, UA: NEGATIVE
Nitrite: NEGATIVE
PH: 7 (ref 5.0–8.0)
PROTEIN: NEGATIVE mg/dL
SPECIFIC GRAVITY, URINE: 1.015 (ref 1.005–1.030)
UROBILINOGEN UA: 0.2 mg/dL (ref 0.0–1.0)

## 2014-08-05 LAB — WET PREP, GENITAL
Clue Cells Wet Prep HPF POC: NONE SEEN
Trich, Wet Prep: NONE SEEN
Yeast Wet Prep HPF POC: NONE SEEN

## 2014-08-05 LAB — OB RESULTS CONSOLE GC/CHLAMYDIA
CHLAMYDIA, DNA PROBE: NEGATIVE
Gonorrhea: NEGATIVE

## 2014-08-05 LAB — OB RESULTS CONSOLE GBS: STREP GROUP B AG: NEGATIVE

## 2014-08-05 NOTE — Patient Instructions (Signed)

## 2014-08-05 NOTE — Progress Notes (Signed)
Concerned over low weight gain. Worried baby has hypothyroidism.  Will check growth [place]. Cultures done today.

## 2014-08-05 NOTE — Addendum Note (Signed)
Addended by: Jill Side on: 08/05/2014 02:46 PM   Modules accepted: Orders

## 2014-08-05 NOTE — Progress Notes (Signed)
White-yellowish discharged with no odor

## 2014-08-06 LAB — GC/CHLAMYDIA PROBE AMP
CT PROBE, AMP APTIMA: NEGATIVE
GC PROBE AMP APTIMA: NEGATIVE

## 2014-08-07 ENCOUNTER — Encounter: Payer: Self-pay | Admitting: Endocrinology

## 2014-08-07 ENCOUNTER — Ambulatory Visit (INDEPENDENT_AMBULATORY_CARE_PROVIDER_SITE_OTHER): Payer: Medicaid Other | Admitting: Endocrinology

## 2014-08-07 VITALS — BP 102/62 | HR 82 | Temp 97.8°F | Resp 14 | Ht 62.5 in | Wt 144.8 lb

## 2014-08-07 DIAGNOSIS — E031 Congenital hypothyroidism without goiter: Secondary | ICD-10-CM

## 2014-08-07 LAB — CULTURE, BETA STREP (GROUP B ONLY)

## 2014-08-07 MED ORDER — SYNTHROID 200 MCG PO TABS: ORAL_TABLET | ORAL | Status: DC

## 2014-08-07 NOTE — Patient Instructions (Signed)
Extra 1/2 pill weekly of 200ug daily

## 2014-08-07 NOTE — Progress Notes (Signed)
Patient ID: Paula French, female   DOB: 01/24/1986, 28 y.o.   MRN: [id number]   Reason for Appointment:  Hypothyroidism, new visit    History of Present Illness:   The Hyothyroidism was first diagnosed  at birth She was diagnosed to have congenital hypothyroidism Previously in her young age she was followed by an endocrinologist but in the last few years had been seen by primary care physicians at the local urgent care Center She says that when her thyroid levels are not optimal as she does have symptoms of significant fatigue, moodiness, hair loss and weight gain However she has mostly been taking 150 mcg of Synthroid and has not had many adjustments in dosage except during pregnancies She also thinks that she feels better when she takes brand name Synthroid compared to levothyroxine Has generally been very compliant with taking her medication in the morning before breakfast  She has had 3 pregnancies and usually needs a higher dose during pregnancies Recently has been followed by her obstetrician Because of rising TSH her Synthroid dose has been gradually increased to 200 mcg during this pregnancy and has been on 200 mcg since about May or June She has had symptoms of significant fatigue during this pregnancy including recently. She tends to go to sleep early in the evenings Also recently had not gain much weight She is complaining about hair loss in the last 2 weeks or so. No cold intolerance, feels relatively warm          Lab Results  Component Value Date   FREET4 1.29 07/21/2014   TSH 2.944 07/21/2014   TSH 2.763 06/12/2014      Past Medical History  Diagnosis Date  . Congenital hypothyroidism   . PONV (postoperative nausea and vomiting)     Past Surgical History  Procedure Laterality Date  . Abdominal hernia repair  1993, 1997  . Oophorectomy Right 09/2013    ovarian cysts    No family history on file.  Social History:  reports that she has quit smoking. Her smoking  use included Cigarettes. She smoked 0.00 packs per day. She has never used smokeless tobacco. She reports that she does not drink alcohol or use illicit drugs.  Allergies:  Allergies  Allergen Reactions  . Penicillins Other (See Comments)    hallucinations  . Sulfa Antibiotics Other (See Comments)    hallucinations      Medication List       This list is accurate as of: 08/07/14  2:30 PM.  Always use your most recent med list.               CONCEPT OB 130-92.4-1 MG Caps  Take 1 capsule by mouth daily.     levothyroxine 200 MCG tablet  Commonly known as:  SYNTHROID, LEVOTHROID  Take 200 mcg by mouth daily before breakfast.        Review of Systems:  CARDIOLOGY: no history of high blood pressure.            GASTROENTEROLOGY:  no Change in bowel habits.       ENDOCRINOLOGY:  no history of Diabetes or glucose intolerance.           No swelling of her legs   Examination:    BP 102/62  Pulse 82  Temp(Src) 97.8 F (36.6 C)  Resp 14  Ht 5' 2.5" (1.588 m)  Wt 144 lb 12.8 oz (65.681 kg)  BMI 26.05 kg/m2  SpO2 97%   General Appearance:  pleasant, averagely built and nourished          Eyes: No proptosis or swelling of the eyes or puffiness of the face.          Neck: The thyroid is nonpalpable. There is no lymphadenopathy .    Cardiovascular: Normal heart sounds, no murmur Respiratory:  Lungs clear Abdomen:  Pregnant uterus present Neurological: REFLEXES: at biceps are normal.     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 She is currently in late pregnancy at 37 weeks She has been quite compliant with her Synthroid and is taking brand name Symptomatically the patient is having nonspecific fatigue and this is likely to be from her advanced pregnancy Her TSH was close to her target about 3 weeks ago at 2.9 and doubt if she is symptomatic from hypothyroidism Her hair loss is probably related to  pregnancy or other factors   Treatment:  She will continue 200 mcg prescription Since her TSH recently was in the upper normal area that is desirable for her pregnancy will increase her dose by 100 mcg weekly She will continue to take her prenatal vitamin at night Advised her to followup in about 4-6 weeks after her delivery as she probably will need lower doses at that time Also will need to be followed closely in the next few months otherwise she will be continued to be followed every 6 months long-term She will continue to take brand name Synthroid    Tomoki Lucken 08/07/2014, 2:30 PM

## 2014-08-10 ENCOUNTER — Encounter: Payer: Self-pay | Admitting: Advanced Practice Midwife

## 2014-08-11 ENCOUNTER — Other Ambulatory Visit: Payer: Self-pay | Admitting: Advanced Practice Midwife

## 2014-08-11 ENCOUNTER — Ambulatory Visit (HOSPITAL_COMMUNITY)
Admission: RE | Admit: 2014-08-11 | Discharge: 2014-08-11 | Disposition: A | Discharge status: Home / Self Care | Payer: Medicaid Other | Source: Ambulatory Visit | Attending: Advanced Practice Midwife | Admitting: Advanced Practice Midwife

## 2014-08-11 DIAGNOSIS — E031 Congenital hypothyroidism without goiter: Secondary | ICD-10-CM

## 2014-08-11 DIAGNOSIS — Z3689 Encounter for other specified antenatal screening: Secondary | ICD-10-CM | POA: Insufficient documentation

## 2014-08-11 DIAGNOSIS — E079 Disorder of thyroid, unspecified: Secondary | ICD-10-CM | POA: Insufficient documentation

## 2014-08-11 DIAGNOSIS — O99280 Endocrine, nutritional and metabolic diseases complicating pregnancy, unspecified trimester: Principal | ICD-10-CM

## 2014-08-13 ENCOUNTER — Encounter: Payer: Self-pay | Admitting: Advanced Practice Midwife

## 2014-08-20 ENCOUNTER — Ambulatory Visit (INDEPENDENT_AMBULATORY_CARE_PROVIDER_SITE_OTHER): Payer: Medicaid Other | Admitting: Family Medicine

## 2014-08-20 VITALS — BP 126/80 | HR 72 | Temp 98.1°F | Wt 145.8 lb

## 2014-08-20 DIAGNOSIS — O0930 Supervision of pregnancy with insufficient antenatal care, unspecified trimester: Secondary | ICD-10-CM

## 2014-08-20 DIAGNOSIS — O0933 Supervision of pregnancy with insufficient antenatal care, third trimester: Secondary | ICD-10-CM

## 2014-08-20 DIAGNOSIS — O360990 Maternal care for other rhesus isoimmunization, unspecified trimester, not applicable or unspecified: Secondary | ICD-10-CM

## 2014-08-20 DIAGNOSIS — O3090 Multiple gestation, unspecified, unspecified trimester: Secondary | ICD-10-CM

## 2014-08-20 DIAGNOSIS — O360931 Maternal care for other rhesus isoimmunization, third trimester, fetus 1: Secondary | ICD-10-CM

## 2014-08-20 LAB — POCT URINALYSIS DIP (DEVICE)
BILIRUBIN URINE: NEGATIVE
Glucose, UA: NEGATIVE mg/dL
HGB URINE DIPSTICK: NEGATIVE
KETONES UR: NEGATIVE mg/dL
LEUKOCYTES UA: NEGATIVE
Nitrite: NEGATIVE
PH: 7.5 (ref 5.0–8.0)
Protein, ur: NEGATIVE mg/dL
Specific Gravity, Urine: 1.015 (ref 1.005–1.030)
Urobilinogen, UA: 0.2 mg/dL (ref 0.0–1.0)

## 2014-08-20 NOTE — Patient Instructions (Signed)
Third Trimester of Pregnancy The third trimester is from week 29 through week 42, months 7 through 9. The third trimester is a time when the fetus is growing rapidly. At the end of the ninth month, the fetus is about 20 inches in length and weighs 6-10 pounds.  BODY CHANGES Your body goes through many changes during pregnancy. The changes vary from woman to woman.   Your weight will continue to increase. You can expect to gain 25-35 pounds (11-16 kg) by the end of the pregnancy.  You may begin to get stretch marks on your hips, abdomen, and breasts.  You may urinate more often because the fetus is moving lower into your pelvis and pressing on your bladder.  You may develop or continue to have heartburn as a result of your pregnancy.  You may develop constipation because certain hormones are causing the muscles that push waste through your intestines to slow down.  You may develop hemorrhoids or swollen, bulging veins (varicose veins).  You may have pelvic pain because of the weight gain and pregnancy hormones relaxing your joints between the bones in your pelvis. Backaches may result from overexertion of the muscles supporting your posture.  You may have changes in your hair. These can include thickening of your hair, rapid growth, and changes in texture. Some women also have hair loss during or after pregnancy, or hair that feels dry or thin. Your hair will most likely return to normal after your baby is born.  Your breasts will continue to grow and be tender. A yellow discharge may leak from your breasts called colostrum.  Your belly button may stick out.  You may feel short of breath because of your expanding uterus.  You may notice the fetus "dropping," or moving lower in your abdomen.  You may have a bloody mucus discharge. This usually occurs a few days to a week before labor begins.  Your cervix becomes thin and soft (effaced) near your due date. WHAT TO EXPECT AT YOUR PRENATAL  EXAMS  You will have prenatal exams every 2 weeks until week 36. Then, you will have weekly prenatal exams. During a routine prenatal visit:  You will be weighed to make sure you and the fetus are growing normally.  Your blood pressure is taken.  Your abdomen will be measured to track your baby's growth.  The fetal heartbeat will be listened to.  Any test results from the previous visit will be discussed.  You may have a cervical check near your due date to see if you have effaced. At around 36 weeks, your caregiver will check your cervix. At the same time, your caregiver will also perform a test on the secretions of the vaginal tissue. This test is to determine if a type of bacteria, Group B streptococcus, is present. Your caregiver will explain this further. Your caregiver may ask you:  What your birth plan is.  How you are feeling.  If you are feeling the baby move.  If you have had any abnormal symptoms, such as leaking fluid, bleeding, severe headaches, or abdominal cramping.  If you have any questions. Other tests or screenings that may be performed during your third trimester include:  Blood tests that check for low iron levels (anemia).  Fetal testing to check the health, activity level, and growth of the fetus. Testing is done if you have certain medical conditions or if there are problems during the pregnancy. FALSE LABOR You may feel small, irregular contractions that   eventually go away. These are called Braxton Hicks contractions, or false labor. Contractions may last for hours, days, or even weeks before true labor sets in. If contractions come at regular intervals, intensify, or become painful, it is best to be seen by your caregiver.  SIGNS OF LABOR   Menstrual-like cramps.  Contractions that are 5 minutes apart or less.  Contractions that start on the top of the uterus and spread down to the lower abdomen and back.  A sense of increased pelvic pressure or back  pain.  A watery or bloody mucus discharge that comes from the vagina. If you have any of these signs before the 37th week of pregnancy, call your caregiver right away. You need to go to the hospital to get checked immediately. HOME CARE INSTRUCTIONS   Avoid all smoking, herbs, alcohol, and unprescribed drugs. These chemicals affect the formation and growth of the baby.  Follow your caregiver's instructions regarding medicine use. There are medicines that are either safe or unsafe to take during pregnancy.  Exercise only as directed by your caregiver. Experiencing uterine cramps is a good sign to stop exercising.  Continue to eat regular, healthy meals.  Wear a good support bra for breast tenderness.  Do not use hot tubs, steam rooms, or saunas.  Wear your seat belt at all times when driving.  Avoid raw meat, uncooked cheese, cat litter boxes, and soil used by cats. These carry germs that can cause birth defects in the baby.  Take your prenatal vitamins.  Try taking a stool softener (if your caregiver approves) if you develop constipation. Eat more high-fiber foods, such as fresh vegetables or fruit and whole grains. Drink plenty of fluids to keep your urine clear or pale yellow.  Take warm sitz baths to soothe any pain or discomfort caused by hemorrhoids. Use hemorrhoid cream if your caregiver approves.  If you develop varicose veins, wear support hose. Elevate your feet for 15 minutes, 3-4 times a day. Limit salt in your diet.  Avoid heavy lifting, wear low heal shoes, and practice good posture.  Rest a lot with your legs elevated if you have leg cramps or low back pain.  Visit your dentist if you have not gone during your pregnancy. Use a soft toothbrush to brush your teeth and be gentle when you floss.  A sexual relationship may be continued unless your caregiver directs you otherwise.  Do not travel far distances unless it is absolutely necessary and only with the approval  of your caregiver.  Take prenatal classes to understand, practice, and ask questions about the labor and delivery.  Make a trial run to the hospital.  Pack your hospital bag.  Prepare the baby's nursery.  Continue to go to all your prenatal visits as directed by your caregiver. SEEK MEDICAL CARE IF:  You are unsure if you are in labor or if your water has broken.  You have dizziness.  You have mild pelvic cramps, pelvic pressure, or nagging pain in your abdominal area.  You have persistent nausea, vomiting, or diarrhea.  You have a bad smelling vaginal discharge.  You have pain with urination. SEEK IMMEDIATE MEDICAL CARE IF:   You have a fever.  You are leaking fluid from your vagina.  You have spotting or bleeding from your vagina.  You have severe abdominal cramping or pain.  You have rapid weight loss or gain.  You have shortness of breath with chest pain.  You notice sudden or extreme swelling   of your face, hands, ankles, feet, or legs.  You have not felt your baby move in over an hour.  You have severe headaches that do not go away with medicine.  You have vision changes. Document Released: 11/14/2001 Document Revised: 11/25/2013 Document Reviewed: 01/21/2013 [employer] Patient Information 2015 [employer]. This information is not intended to replace advice given to you by your health care provider. Make sure you discuss any questions you have with your health care provider.  

## 2014-08-20 NOTE — Progress Notes (Signed)
Patient without complaints.  Having some ctxs.  Denies vaginal bleeding, abnormal vaginal discharge, loss of fluid.  Denies abdominal pain, headache, scotoma.  Reports good fetal activity.  Labor precautions reviewed.  Follow up in 1 weeks.

## 2014-08-25 ENCOUNTER — Inpatient Hospital Stay (HOSPITAL_COMMUNITY)
Admission: AD | Admit: 2014-08-25 | Discharge: 2014-08-27 | DRG: 775 | Disposition: A | Discharge status: Home / Self Care | Payer: Medicaid Other | Source: Ambulatory Visit | Attending: Obstetrics & Gynecology | Admitting: Obstetrics & Gynecology

## 2014-08-25 ENCOUNTER — Encounter (HOSPITAL_COMMUNITY): Payer: Self-pay | Admitting: *Deleted

## 2014-08-25 DIAGNOSIS — IMO0001 Reserved for inherently not codable concepts without codable children: Secondary | ICD-10-CM

## 2014-08-25 DIAGNOSIS — Z87891 Personal history of nicotine dependence: Secondary | ICD-10-CM | POA: Diagnosis not present

## 2014-08-25 DIAGNOSIS — E079 Disorder of thyroid, unspecified: Principal | ICD-10-CM | POA: Diagnosis present

## 2014-08-25 DIAGNOSIS — O479 False labor, unspecified: Secondary | ICD-10-CM | POA: Diagnosis present

## 2014-08-25 DIAGNOSIS — O360990 Maternal care for other rhesus isoimmunization, unspecified trimester, not applicable or unspecified: Secondary | ICD-10-CM | POA: Diagnosis present

## 2014-08-25 DIAGNOSIS — Z880 Allergy status to penicillin: Secondary | ICD-10-CM

## 2014-08-25 DIAGNOSIS — O99284 Endocrine, nutritional and metabolic diseases complicating childbirth: Secondary | ICD-10-CM | POA: Diagnosis present

## 2014-08-25 DIAGNOSIS — E031 Congenital hypothyroidism without goiter: Secondary | ICD-10-CM | POA: Diagnosis present

## 2014-08-25 LAB — CBC
HCT: 39.5 % (ref 36.0–46.0)
HEMOGLOBIN: 14.1 g/dL (ref 12.0–15.0)
MCH: 31.3 pg (ref 26.0–34.0)
MCHC: 35.7 g/dL (ref 30.0–36.0)
MCV: 87.8 fL (ref 78.0–100.0)
PLATELETS: 160 10*3/uL (ref 150–400)
RBC: 4.5 MIL/uL (ref 3.87–5.11)
RDW: 13.4 % (ref 11.5–15.5)
WBC: 13.5 10*3/uL — AB (ref 4.0–10.5)

## 2014-08-25 MED ORDER — LIDOCAINE HCL (PF) 1 % IJ SOLN
INTRAMUSCULAR | Status: AC
  Filled 2014-08-25: qty 30

## 2014-08-25 MED ORDER — OXYTOCIN 40 UNITS IN LACTATED RINGERS INFUSION - SIMPLE MED
INTRAVENOUS | Status: AC
  Administered 2014-08-25: 40 [IU]
  Filled 2014-08-25: qty 1000

## 2014-08-25 MED ORDER — CITRIC ACID-SODIUM CITRATE 334-500 MG/5ML PO SOLN: 30.0000 mL | ORAL | Status: DC | PRN

## 2014-08-25 MED ORDER — ONDANSETRON HCL 4 MG/2ML IJ SOLN: 4.0000 mg | Freq: Four times a day (QID) | INTRAMUSCULAR | Status: DC | PRN

## 2014-08-25 MED ORDER — OXYCODONE-ACETAMINOPHEN 5-325 MG PO TABS: 2.0000 | ORAL_TABLET | ORAL | Status: DC | PRN

## 2014-08-25 MED ORDER — LACTATED RINGERS IV SOLN: 500.0000 mL | INTRAVENOUS | Status: DC | PRN

## 2014-08-25 MED ORDER — OXYTOCIN 40 UNITS IN LACTATED RINGERS INFUSION - SIMPLE MED: 62.5000 mL/h | INTRAVENOUS | Status: DC

## 2014-08-25 MED ORDER — LIDOCAINE HCL (PF) 1 % IJ SOLN
30.0000 mL | INTRAMUSCULAR | Status: DC | PRN
  Filled 2014-08-25: qty 30

## 2014-08-25 MED ORDER — IBUPROFEN 600 MG PO TABS
600.0000 mg | ORAL_TABLET | Freq: Four times a day (QID) | ORAL | Status: DC
  Administered 2014-08-25 – 2014-08-27 (×6): 600 mg via ORAL
  Filled 2014-08-25 (×6): qty 1

## 2014-08-25 MED ORDER — OXYTOCIN BOLUS FROM INFUSION
500.0000 mL | INTRAVENOUS | Status: DC
  Administered 2014-08-25: 500 mL via INTRAVENOUS

## 2014-08-25 MED ORDER — LACTATED RINGERS IV SOLN: INTRAVENOUS | Status: DC

## 2014-08-25 MED ORDER — OXYCODONE-ACETAMINOPHEN 5-325 MG PO TABS: 1.0000 | ORAL_TABLET | ORAL | Status: DC | PRN

## 2014-08-25 MED ORDER — ACETAMINOPHEN 325 MG PO TABS: 650.0000 mg | ORAL_TABLET | ORAL | Status: DC | PRN

## 2014-08-25 NOTE — MAU Note (Signed)
Pt presented to MAU with contractions. Pt examined in the triage room 9cm w/ bbow. Pt breathing through uc;s well.  MAU provider @ bedside during transport to L&D. FHT's 159 per Doppler.

## 2014-08-25 NOTE — H&P (Signed)
Kaneesha Constantino is a 28 y.o. female G3P2002 with IUP at [redacted]w[redacted]d presenting for SOL. Patient noted to be complete with an urge to push down in the MAU. Immediately transported to L&D. No LOF or vaginal bleeding prior to delivery. 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 Congenital hypothyroidism   . PONV (postoperative nausea and vomiting)     Past Surgical History: Past Surgical History  Procedure Laterality Date  . Abdominal hernia repair  1993, 1997  . Oophorectomy Right 09/2013    ovarian cysts    Obstetrical History: OB History   Grav Para Term Preterm Abortions TAB SAB Ect Mult Living   Social History: History   Social History  . Marital Status: Single    Spouse Name: N/A    Number of Children: N/A  . Years of Education: N/A   Social History Main Topics  . Smoking status: Former Smoker    Types: Cigarettes  . Smokeless tobacco: Never Used  . Alcohol Use: No  . Drug Use: No  . Sexual Activity: Yes   Other Topics Concern  . None   Social History Narrative  . None    Family History: History reviewed. No pertinent family history.  Allergies: Allergies  Allergen Reactions  . Penicillins Other (See Comments)    hallucinations  . Sulfa Antibiotics Other (See Comments)    hallucinations    Prescriptions prior to admission  Medication Sig Dispense Refill  . Prenat w/o A Vit-FeFum-FePo-FA (CONCEPT OB) 130-92.4-1 MG CAPS Take 1 capsule by mouth daily.  30 capsule  11  . SYNTHROID 200 MCG tablet 1 tablet daily and extra half tablet on Saturdays  32 tablet  3     Review of Systems   Constitutional: No fever, chills, fatigue  not currently breastfeeding. General appearance: alert, cooperative and moderate distress Lungs: clear  to auscultation bilaterally Heart: regular rate and rhythm Abdomen: soft, non-tender; bowel sounds normal Extremities: Homans sign is negative, no sign of DVT DTR's 2+ Presentation: cephalic  Prenatal labs: ABO, Rh: O/Negative/-- (03/03 0000) Antibody: Negative (03/03 0000) Rubella:  immune  RPR: NON REAC (07/10 1100)  HBsAg: Negative (03/03 0000)  HIV: NONREACTIVE (07/10 1100)  GBS: Negative (09/02 0000)  1 hr Glucola 105  Genetic screening  Normal Anatomy US normal   Prenatal Transfer Tool  Maternal Diabetes: No Genetic Screening: Normal Maternal Ultrasounds/Referrals: Normal Fetal Ultrasounds or other Referrals:  None Maternal Substance Abuse:  No Significant Maternal Medications:  Meds include: Syntroid Significant Maternal Lab Results: Lab values include: Group B Strep negative, Rh negative   Assessment: Jonte Wollam is a 28 y.o. G3P2002 at [redacted]w[redacted]d presenting with SOL, complete and pushing #Labor: NSVD #ID:  GBS neg #MOF: Breast/bottle #MOC:Vasectomy #Circ:  Outpatient  Joanna Puff 08/25/2014, 9:04 PM Evaluation and management procedures were performed by Resident physician under my supervision/collaboration. Chart reviewed, patient examined by me and I agree with management and plan. Danae Orleans, CNM 08/25/2014 10:26 PM

## 2014-08-26 LAB — ABO/RH: ABO/RH(D): O NEG

## 2014-08-26 LAB — RPR

## 2014-08-26 MED ORDER — PRENATAL MULTIVITAMIN CH
1.0000 | ORAL_TABLET | Freq: Every day | ORAL | Status: DC
  Administered 2014-08-26: 1 via ORAL
  Filled 2014-08-26: qty 1

## 2014-08-26 MED ORDER — TETANUS-DIPHTH-ACELL PERTUSSIS 5-2.5-18.5 LF-MCG/0.5 IM SUSP: 0.5000 mL | Freq: Once | INTRAMUSCULAR | Status: DC

## 2014-08-26 MED ORDER — RHO D IMMUNE GLOBULIN 1500 UNIT/2ML IJ SOSY
300.0000 ug | PREFILLED_SYRINGE | Freq: Once | INTRAMUSCULAR | Status: AC
  Administered 2014-08-26: 300 ug via INTRAMUSCULAR
  Filled 2014-08-26: qty 2

## 2014-08-26 MED ORDER — SIMETHICONE 80 MG PO CHEW: 80.0000 mg | CHEWABLE_TABLET | ORAL | Status: DC | PRN

## 2014-08-26 MED ORDER — WITCH HAZEL-GLYCERIN EX PADS: 1.0000 "application " | MEDICATED_PAD | CUTANEOUS | Status: DC | PRN

## 2014-08-26 MED ORDER — ONDANSETRON HCL 4 MG PO TABS: 4.0000 mg | ORAL_TABLET | ORAL | Status: DC | PRN

## 2014-08-26 MED ORDER — ONDANSETRON HCL 4 MG/2ML IJ SOLN: 4.0000 mg | INTRAMUSCULAR | Status: DC | PRN

## 2014-08-26 MED ORDER — SENNOSIDES-DOCUSATE SODIUM 8.6-50 MG PO TABS
2.0000 | ORAL_TABLET | ORAL | Status: DC
  Administered 2014-08-27: 2 via ORAL
  Filled 2014-08-26: qty 2

## 2014-08-26 MED ORDER — LEVOTHYROXINE SODIUM 200 MCG PO TABS
200.0000 ug | ORAL_TABLET | Freq: Every day | ORAL | Status: DC
  Administered 2014-08-26 – 2014-08-27 (×2): 200 ug via ORAL
  Filled 2014-08-26 (×2): qty 1

## 2014-08-26 MED ORDER — ZOLPIDEM TARTRATE 5 MG PO TABS: 5.0000 mg | ORAL_TABLET | Freq: Every evening | ORAL | Status: DC | PRN

## 2014-08-26 MED ORDER — BENZOCAINE-MENTHOL 20-0.5 % EX AERO
1.0000 "application " | INHALATION_SPRAY | CUTANEOUS | Status: DC | PRN
  Administered 2014-08-26: 1 via TOPICAL
  Filled 2014-08-26: qty 56

## 2014-08-26 MED ORDER — DIPHENHYDRAMINE HCL 25 MG PO CAPS: 25.0000 mg | ORAL_CAPSULE | Freq: Four times a day (QID) | ORAL | Status: DC | PRN

## 2014-08-26 MED ORDER — DIBUCAINE 1 % RE OINT: 1.0000 "application " | TOPICAL_OINTMENT | RECTAL | Status: DC | PRN

## 2014-08-26 MED ORDER — LANOLIN HYDROUS EX OINT: TOPICAL_OINTMENT | CUTANEOUS | Status: DC | PRN

## 2014-08-26 NOTE — Lactation Note (Signed)
This note was copied from the chart of Paula French. Lactation Consultation Note  P3, First time breastfeeding.  Did not breastfeed first son, second son she breastfed for one day.   Second son has allergies to milk and eggs. She states she had no milk supply.  Has no thryoid. She has been leaking since this babies arrival. Needed help with positioning and latch.  Her nipples are tender.  Provided gels and hand expression. Reviewed cluster feeding and how breastfeeding reduces allergies. Mom encouraged to feed baby 8-12 times/24 hours and with feeding cues.  Mom made aware of O/P services, breastfeeding support groups, community resources, and our phone # for post-discharge questions.    Patient Name: Paula Rye Dorado [id number] Date: 08/26/2014 Reason for consult: Initial assessment   Maternal Data Has patient been taught Hand Expression?: Yes Does the patient have breastfeeding experience prior to this delivery?: Yes  Feeding Feeding Type: Breast Fed Length of feed: 35 min  LATCH Score/Interventions Latch: Grasps breast easily, tongue down, lips flanged, rhythmical sucking.  Audible Swallowing: Spontaneous and intermittent  Type of Nipple: Everted at rest and after stimulation  Comfort (Breast/Nipple): Filling, red/small blisters or bruises, mild/mod discomfort  Problem noted: Mild/Moderate discomfort Interventions (Mild/moderate discomfort): Hand expression;Comfort gels  Hold (Positioning): Assistance needed to correctly position infant at breast and maintain latch.  LATCH Score: 8  Lactation Tools Discussed/Used     Consult Status Consult Status: Follow-up Date: 08/27/14 Follow-up type: In-patient    Dahlia Byes [facility] 08/26/2014, 8:20 PM

## 2014-08-26 NOTE — Progress Notes (Signed)
Post Partum Day 1 SVD Subjective: no complaints, up ad lib, voiding, tolerating PO and + flatus  Objective: Blood pressure 109/70, pulse 50, temperature 98.2 F (36.8 C), temperature source Oral, resp. rate 20, SpO2 100.00%, unknown if currently breastfeeding.  Physical Exam:  General: alert, cooperative and no distress Lochia: appropriate Uterine Fundus: firm DVT Evaluation: No evidence of DVT seen on physical exam. Negative Homan's sign. No cords or calf tenderness. No significant calf/ankle edema.   Recent Labs  08/25/14 2047  HGB 14.1  HCT 39.5    Assessment/Plan: Plan for discharge tomorrow and Breastfeeding and bottle feeding Significant other will be getting a vasectomy Outpatient circ   LOS: 1 day   Joanna Puff 08/26/2014, 7:28 AM   OB fellow attestation Post Partum Day 1 I have seen and examined this patient and agree with above documentation in the resident's note.   Manya Balash is a 28 y.o. G3P3003 s/p SVD.  Pt denies problems with ambulating, voiding or po intake. Pain is well controlled.  Plan for birth control is vasectomy.  Method of Feeding: breast and bottle  PE:  BP 109/70  Pulse 50  Temp(Src) 98.2 F (36.8 C) (Oral)  Resp 20  SpO2 100%  Breastfeeding? Unknown Fundus firm  Plan for discharge: tomorrow - circ outpt, information placed in discharge instructions  Perry Mount, MD 2:24 PM

## 2014-08-26 NOTE — Progress Notes (Signed)
UR chart review completed.  

## 2014-08-27 ENCOUNTER — Encounter: Payer: Medicaid Other | Admitting: Advanced Practice Midwife

## 2014-08-27 LAB — RH IG WORKUP (INCLUDES ABO/RH)
ABO/RH(D): O NEG
ANTIBODY SCREEN: POSITIVE
DAT, IgG: NEGATIVE
FETAL SCREEN: NEGATIVE
GESTATIONAL AGE(WKS): 39.6
Unit division: 0

## 2014-08-27 MED ORDER — DOCUSATE SODIUM 100 MG PO CAPS: 100.0000 mg | ORAL_CAPSULE | Freq: Two times a day (BID) | ORAL | Status: DC | PRN

## 2014-08-27 MED ORDER — IBUPROFEN 600 MG PO TABS: 600.0000 mg | ORAL_TABLET | Freq: Four times a day (QID) | ORAL | Status: DC

## 2014-08-27 NOTE — Discharge Summary (Signed)
Obstetric Discharge Summary Reason for Admission: onset of labor Prenatal Procedures: none Intrapartum Procedures: spontaneous vaginal delivery Postpartum Procedures: none Complications-Operative and Postpartum: none Hemoglobin  Date Value Ref Range Status  08/25/2014 14.1  12.0 - 15.0 g/dL Final     HCT  Date Value Ref Range Status  08/25/2014 39.5  36.0 - 46.0 % Final    Discharge Diagnoses: Term Pregnancy-delivered  Hospital Course:  Paula French is a 28 y.o. G3P3003 who presented with SOL: noted to be complete with an urge to push in the MAU.  She had a uncomplicated SVD. She was able to ambulate, tolerate PO and void normally. She was discharged home with instructions for postpartum care.    Delivery Note At 8:45 PM a viable female was delivered via Vaginal, Spontaneous Delivery (Presentation: Left Occiput Anterior). APGAR: 9, 9; weight .  Placenta status: Intact, Spontaneous. Cord: 3 vessels with the following complications: 1 loose nuchal which baby was delivered through. Cord pH: pending   Anesthesia: None  Episiotomy: None  Lacerations: None  Suture Repair: NA  Est. Blood Loss (mL): 200   Mom to postpartum. Baby to Couplet care / Skin to Skin.  Physical Exam:  General: alert and cooperative Lochia: appropriate Uterine Fundus: firm DVT Evaluation: No evidence of DVT seen on physical exam. Negative Homan's sign. No cords or calf tenderness.  Discharge Information: Date: 08/27/2014 Activity: pelvic rest Diet: routine Medications: PNV and Ibuprofen Baby feeding: plans to breastfeed Contraception: vasectomy Condition: stable Instructions: refer to practice specific booklet Discharge to: home Hypothyroidism: Patient advised to f/u with endocrinology for appropriate postpartum Synthroid taper. Follow-up Information   Follow up with [facility]. Call today. (to make a postpartum appointment in 4-6 weeks)    Specialty:  Obstetrics and Gynecology   Contact  information:   [address] [place] [address] [phone]      Newborn Data: Live born female  Birth Weight: 7 lb 11.3 oz (3496 g) APGAR: 9, 9  Home with mother.  Joanna Puff, MD [facility] PGY-1 08/27/2014, 7:54 AM  I have seen this patient and agree with the above resident's note.  LEFTWICH-KIRBY, Leverett Camplin Certified Nurse-Midwife

## 2014-08-27 NOTE — Discharge Instructions (Signed)
Offices that do circumcisions: [facility] [phone] [facility]) $317.20 within 4 weeks of birth, [facility] [phone] [facility]) $250 within 7 days of birth, [facility] [phone] [facility]) $175 within 2 weeks of birth Vaginal Delivery, Care After Refer to this sheet in the next few weeks. These discharge instructions provide you with information on caring for yourself after delivery. Your caregiver may also give you specific instructions. Your treatment has been planned according to the most current medical practices available, but problems sometimes occur. Call your caregiver if you have any problems or questions after you go home. HOME CARE INSTRUCTIONS  Take over-the-counter or prescription medicines only as directed by your caregiver or pharmacist.  Do not drink alcohol, especially if you are breastfeeding or taking medicine to relieve pain.  Do not chew or smoke tobacco.  Do not use illegal drugs.  Continue to use good perineal care. Good perineal care includes:  Wiping your perineum from front to back.  Keeping your perineum clean.  Do not use tampons or douche until your caregiver says it is okay.  Shower, wash your hair, and take tub baths as directed by your caregiver.  Wear a well-fitting bra that provides breast support.  Eat healthy foods.  Drink enough fluids to keep your urine clear or pale yellow.  Eat high-fiber foods such as whole grain cereals and breads, brown rice, beans, and fresh fruits and vegetables every day. These foods may help prevent or relieve constipation.  Follow your caregiver's recommendations regarding resumption of activities such as climbing stairs, driving, lifting, exercising, or traveling.  Talk to your caregiver about resuming sexual activities. Resumption of sexual activities is dependent upon your risk of infection, your rate of healing, and your comfort and desire to resume sexual activity.  Try to have someone  help you with your household activities and your newborn for at least a few days after you leave the hospital.  Rest as much as possible. Try to rest or take a nap when your newborn is sleeping.  Increase your activities gradually.  Keep all of your scheduled postpartum appointments. It is very important to keep your scheduled follow-up appointments. At these appointments, your caregiver will be checking to make sure that you are healing physically and emotionally. SEEK MEDICAL CARE IF:   You are passing large clots from your vagina. Save any clots to show your caregiver.  You have a foul smelling discharge from your vagina.  You have trouble urinating.  You are urinating frequently.  You have pain when you urinate.  You have a change in your bowel movements.  You have increasing redness, pain, or swelling near your vaginal incision (episiotomy) or vaginal tear.  You have pus draining from your episiotomy or vaginal tear.  Your episiotomy or vaginal tear is separating.  You have painful, hard, or reddened breasts.  You have a severe headache.  You have blurred vision or see spots.  You feel sad or depressed.  You have thoughts of hurting yourself or your newborn.  You have questions about your care, the care of your newborn, or medicines.  You are dizzy or light-headed.  You have a rash.  You have nausea or vomiting.  You were breastfeeding and have not had a menstrual period within 12 weeks after you stopped breastfeeding.  You are not breastfeeding and have not had a menstrual period by the 12th week after delivery.  You have a fever. SEEK IMMEDIATE MEDICAL CARE IF:   You have persistent  You have persistent pain. °· You have chest pain. °· You have shortness of breath. °· You faint. °· You have leg pain. °· You have stomach pain. °· Your vaginal bleeding saturates two or more sanitary pads in 1 hour. °MAKE SURE YOU:  °· Understand these instructions. °· Will watch your  condition. °· Will get help right away if you are not doing well or get worse. °Document Released: 11/17/2000 Document Revised: 04/06/2014 Document Reviewed: 07/17/2012 °ExitCare® Patient Information ©2015 [employer]. This information is not intended to replace advice given to you by your health care provider. Make sure you discuss any questions you have with your health care provider. ° °

## 2014-09-02 ENCOUNTER — Ambulatory Visit: Payer: Medicaid Other | Admitting: *Deleted

## 2014-09-02 VITALS — BP 112/71 | HR 72 | Temp 98.4°F

## 2014-09-02 DIAGNOSIS — R300 Dysuria: Secondary | ICD-10-CM

## 2014-09-02 LAB — POCT URINALYSIS DIP (DEVICE)
Bilirubin Urine: NEGATIVE
GLUCOSE, UA: NEGATIVE mg/dL
Hgb urine dipstick: NEGATIVE
KETONES UR: NEGATIVE mg/dL
Nitrite: NEGATIVE
Protein, ur: NEGATIVE mg/dL
Specific Gravity, Urine: 1.015 (ref 1.005–1.030)
Urobilinogen, UA: 0.2 mg/dL (ref 0.0–1.0)
pH: 7 (ref 5.0–8.0)

## 2014-09-02 NOTE — Progress Notes (Signed)
Paula French in for nurse visit for c/o pain with urination since 08/30/14. States son hit her in the stomach and pain started after that.  Denies burning , state it hurts when I urinate in lower abdomen or when I have a bm- has a sharp , shooting pain.. Had baby 8 days ago vaginally. Urinalysis negative except leukocytes - will send for culture. Aquilla Hackerdvised [name] to take ibuprofen for the pain as needed and hopefully will go away in a few days. [facility] or anyother unusual symptoms.  Reviewed postpartum appointment date with her.  Reviewed pt. C/o with Dr. Adrian BlackwaterStinson, no other orders given

## 2014-09-03 LAB — URINE CULTURE

## 2014-09-04 ENCOUNTER — Ambulatory Visit: Payer: Medicaid Other

## 2014-09-28 ENCOUNTER — Other Ambulatory Visit (INDEPENDENT_AMBULATORY_CARE_PROVIDER_SITE_OTHER): Payer: Medicaid Other

## 2014-09-28 DIAGNOSIS — E031 Congenital hypothyroidism without goiter: Secondary | ICD-10-CM

## 2014-09-28 LAB — T4, FREE: FREE T4: 1.07 ng/dL (ref 0.60–1.60)

## 2014-09-28 LAB — TSH: TSH: 0.13 u[IU]/mL — ABNORMAL LOW (ref 0.35–4.50)

## 2014-10-02 ENCOUNTER — Encounter: Payer: Self-pay | Admitting: Endocrinology

## 2014-10-02 ENCOUNTER — Ambulatory Visit (INDEPENDENT_AMBULATORY_CARE_PROVIDER_SITE_OTHER): Payer: Medicaid Other | Admitting: Endocrinology

## 2014-10-02 VITALS — BP 104/62 | HR 85 | Temp 97.8°F | Resp 14 | Ht 62.5 in | Wt 129.8 lb

## 2014-10-02 DIAGNOSIS — E031 Congenital hypothyroidism without goiter: Secondary | ICD-10-CM

## 2014-10-02 MED ORDER — LEVOTHYROXINE SODIUM 150 MCG PO TABS: 150.0000 ug | ORAL_TABLET | Freq: Every day | ORAL | Status: DC

## 2014-10-02 NOTE — Progress Notes (Addendum)
Patient ID: Paula French, female   DOB: Oct 26, 1986, 28 y.o.   MRN: [id number]   Reason for Appointment:  Hypothyroidism, new visit    History of Present Illness:    Background history:  Her hypothyroidism  was first diagnosed  at birth She was diagnosed to have congenital hypothyroidism Previously in her youth she was followed by an endocrinologist but in the last few years had been seen by primary care physicians at the local urgent care Center  She says that when her thyroid levels are not optimal as she does have symptoms of significant fatigue, moodiness, hair loss and weight gain She has mostly been taking 150 mcg of Synthroid and has not had many adjustments in dosage done except during pregnancies She has had 3 pregnancies and usually needs a higher dose during pregnancies  She also thinks that she feels better when she takes brand name Synthroid compared to levothyroxine Has generally been very compliant with taking her medication in the morning before breakfast  Although she was taking at least 200 g daily during her pregnancy she is now taking only 150 g on her own She delivered about 6 weeks ago and after that went down to 175 g on her own  She took this only for about a week or 2. Now for the last 2-3 weeks has been taking 150 g She feels fairly good subjectively except for tiredness because of having a small baby  She is not quite back to her prepregnancy weight of about 120-125         Lab Results  Component Value Date   FREET4 1.07 09/28/2014   FREET4 1.29 07/21/2014   TSH 0.13* 09/28/2014   TSH 2.944 07/21/2014   TSH 2.763 06/12/2014      Past Medical History  Diagnosis Date  . Congenital hypothyroidism   . PONV (postoperative nausea and vomiting)     Past Surgical History  Procedure Laterality Date  . Abdominal hernia repair  1993, 1997  . Oophorectomy Right 09/2013    ovarian cysts    No family history on file.  Social History:  reports that she  has quit smoking. Her smoking use included Cigarettes. She smoked 0.00 packs per day. She has never used smokeless tobacco. She reports that she does not drink alcohol or use illicit drugs.  Allergies:  Allergies  Allergen Reactions  . Penicillins Other (See Comments)    hallucinations  . Sulfa Antibiotics Other (See Comments)    hallucinations      Medication List       This list is accurate as of: 10/02/14  3:30 PM.  Always use your most recent med list.               CONCEPT OB 130-92.4-1 MG Caps  Take 1 capsule by mouth daily.     docusate sodium 100 MG capsule  Commonly known as:  COLACE  Take 1 capsule (100 mg total) by mouth 2 (two) times daily as needed.     ibuprofen 600 MG tablet  Commonly known as:  ADVIL,MOTRIN  Take 1 tablet (600 mg total) by mouth every 6 (six) hours.     levothyroxine 150 MCG tablet  Commonly known as:  SYNTHROID, LEVOTHROID  Take 150 mcg by mouth daily before breakfast.        Review of Systems:  No recent anemia   Examination:    BP 104/62  Pulse 85  Temp(Src) 97.8 F (36.6 C)  Resp 14  Ht 5' 2.5" (1.588 m)  Wt 129 lb 12.8 oz (58.877 kg)  BMI 23.35 kg/m2  SpO2 97%   General Appearance: pleasant, well-looking   REFLEXES: at biceps are normal.     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

## 2014-10-06 ENCOUNTER — Encounter: Payer: Self-pay | Admitting: Endocrinology

## 2014-10-08 ENCOUNTER — Encounter: Payer: Self-pay | Admitting: Obstetrics and Gynecology

## 2014-10-08 ENCOUNTER — Ambulatory Visit (INDEPENDENT_AMBULATORY_CARE_PROVIDER_SITE_OTHER): Payer: Medicaid Other | Admitting: Obstetrics and Gynecology

## 2014-10-08 VITALS — BP 114/74 | HR 67 | Temp 98.0°F | Ht 62.5 in | Wt 129.7 lb

## 2014-10-08 DIAGNOSIS — N941 Dyspareunia: Secondary | ICD-10-CM

## 2014-10-08 DIAGNOSIS — IMO0002 Reserved for concepts with insufficient information to code with codable children: Secondary | ICD-10-CM

## 2014-10-08 MED ORDER — FLUCONAZOLE 150 MG PO TABS: 150.0000 mg | ORAL_TABLET | Freq: Once | ORAL | Status: DC

## 2014-10-08 NOTE — Progress Notes (Signed)
  Subjective:     Paula French is a 28 y.o. female [phone] who presents for a postpartum visit. She is 5 weeks postpartum following a spontaneous vaginal delivery. I have fully reviewed the prenatal and intrapartum course. The delivery was at 39 gestational weeks. Outcome: spontaneous vaginal delivery. Anesthesia: epidural. Postpartum course has been uncomplicated until 2 days ago began have breast discomfort. . Baby's course has been uncomplicated. Baby is feeding by breast. Bleeding no bleeding. Bowel function is normal. Bladder function is normal. Patient is not sexually active. Contraception method is abstinence. Postpartum depression screening: negative. Husband will be getting vasectomy.   2d of nipples itching and burning, slight red around nipples. Also vaginal itching and white discharge. Baby not known to have thrush.   Review of Systems Pertinent items are noted in HPI.  States she has dysparunia since beginning sexual activity. Also longstanding dysmenorrhea. Does not like to take ibuprofen or any meds.  [place] but no F/U due to getting pregnant and moving. Would like to pursue tx.  Objective:    BP 114/74 mmHg  Pulse 67  Temp(Src) 98 F (36.7 C)  Ht 5' 2.5" (1.588 m)  Wt 129 lb 11.2 oz (58.832 kg)  BMI 23.33 kg/m2  Breastfeeding? Yes  General:  alert, cooperative and no distress   Breasts:  inspection negative, no nipple discharge or bleeding, no masses or nodularity palpable and slightly erythematous surrounding both areolae and nipples tender Lactational  Lungs: clear to auscultation bilaterally  Heart:  regular rate and rhythm, S1, S2 normal, no murmur, click, rub or gallop  Abdomen: soft, non-tender; bowel sounds normal; no masses,  no organomegaly   Vulva:  normal  Vagina: normal vagina  Cervix:  closed post, no CMT  Corpus: normal size, contour, position, consistency, mobility, non-tender  Adnexa:  normal adnexa  Rectal Exam: Not  performed.        Assessment:     5 wks postpartum exam. Pap smear not done at today's visit.   Yeast infection nipples and vagina Dysparunia and dysmenorrhea Plan:    1. Contraception: vasectomy 2. ROI records GYN surgery 3. Rx All purpose nipple cream and Diflucan. Have Pediatrician tx NB for thrush 3. Follow up in: 1 months  GYN clinic or as needed.

## 2014-10-08 NOTE — Patient Instructions (Signed)

## 2014-10-08 NOTE — Progress Notes (Signed)
Here for postpartum visit. C/o breast pain that started yesterday- states thinks she has thrush.

## 2014-11-09 ENCOUNTER — Other Ambulatory Visit (INDEPENDENT_AMBULATORY_CARE_PROVIDER_SITE_OTHER): Payer: Medicaid Other

## 2014-11-09 DIAGNOSIS — E031 Congenital hypothyroidism without goiter: Secondary | ICD-10-CM

## 2014-11-09 LAB — TSH: TSH: 1.91 u[IU]/mL (ref 0.35–4.50)

## 2014-11-09 LAB — T4, FREE: Free T4: 1.01 ng/dL (ref 0.60–1.60)

## 2014-11-12 ENCOUNTER — Ambulatory Visit: Payer: Medicaid Other | Admitting: Endocrinology

## 2014-11-18 ENCOUNTER — Encounter: Payer: Self-pay | Admitting: Endocrinology

## 2014-11-18 ENCOUNTER — Ambulatory Visit (INDEPENDENT_AMBULATORY_CARE_PROVIDER_SITE_OTHER): Payer: Medicaid Other | Admitting: Endocrinology

## 2014-11-18 VITALS — BP 100/70 | HR 76 | Temp 97.7°F | Resp 14 | Ht 62.5 in | Wt 128.2 lb

## 2014-11-18 DIAGNOSIS — E031 Congenital hypothyroidism without goiter: Secondary | ICD-10-CM

## 2014-11-18 NOTE — Progress Notes (Signed)
Patient ID: Paula French, female   DOB: [birth year], 28 y.o.   MRN: [id number]   Reason for Appointment:  Hypothyroidism, new visit    History of Present Illness:    Background history:  Her hypothyroidism  was first diagnosed  at birth She was diagnosed to have congenital hypothyroidism Previously in her youth she was followed by an endocrinologist but in the last few years had been seen by primary care physicians at the local urgent care Center When her thyroid levels are not optimal as she does have symptoms of significant fatigue, moodiness, hair loss and weight gain  She has mostly been taking 150 mcg of Synthroid when she is not pregnant She has had 3 pregnancies and usually needs a higher dose during pregnancies She also thinks that she feels better when she takes brand name Synthroid compared to levothyroxine Has generally been very compliant with taking her medication in the morning before breakfast; takes her multivitamin later  She taking at least 200 g daily during her pregnancy; she is now back to taking only 150 g since about a month after her last delivery in 9/15  She feels fairly good subjectively except for tiredness because of having a small baby and lack of sleep She is not  back to her prepregnancy weight of about 120-125 but has started back working No cord intolerance or dry skin Her thyroid levels are back to normal         Lab Results  Component Value Date   FREET4 1.01 11/09/2014   FREET4 1.07 09/28/2014   FREET4 1.29 07/21/2014   TSH 1.91 11/09/2014   TSH 0.13* 09/28/2014   TSH 2.944 07/21/2014      Past Medical History  Diagnosis Date  . Congenital hypothyroidism   . PONV (postoperative nausea and vomiting)     Past Surgical History  Procedure Laterality Date  . Abdominal hernia repair  1993, 1997  . Oophorectomy Right 09/2013    ovarian cysts    No family history on file.  Social History:  reports that she has quit smoking. Her  smoking use included Cigarettes. She smoked 0.00 packs per day. She has never used smokeless tobacco. She reports that she does not drink alcohol or use illicit drugs.  Allergies:  Allergies  Allergen Reactions  . Penicillins Other (See Comments)    hallucinations  . Sulfa Antibiotics Other (See Comments)    hallucinations      Medication List       This list is accurate as of: 11/18/14  9:15 AM.  Always use your most recent med list.               CONCEPT OB 130-92.4-1 MG Caps  Take 1 capsule by mouth daily.     flintstones complete 60 MG chewable tablet  Chew 1 tablet by mouth daily.     fluconazole 150 MG tablet  Commonly known as:  DIFLUCAN  Take 1 tablet (150 mg total) by mouth once. May repeat 2-3 days if needed     levothyroxine 150 MCG tablet  Commonly known as:  SYNTHROID, LEVOTHROID  Take 1 tablet (150 mcg total) by mouth daily before breakfast.        Review of Systems:  No history of gestational diabetes   Examination:    BP 100/70 mmHg  Pulse 76  Temp(Src) 97.7 F (36.5 C)  Resp 14  Ht 5' 2.5" (1.588 m)  Wt 128 lb 3.2 oz (58.151 kg)  BMI  23.06 kg/m2  SpO2 97%   General Appearance: She looks well   REFLEXES: at biceps are normal.     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 She is currently nearly 3 months after her last delivery Although she is complaining of fatigue which is related to her not getting enough rest at night with her baby her thyroid levels are quite normal now Her weight is still a few pounds higher than her prepregnancy weight  Treatment:  She will continue 150 mcg brand name Synthroid for now Will check her labs and see her back in about 6  months  Discussed that since she has an atrophic thyroid she should be able to do well with a stable dose of brand name Synthroid and may need only annual follow-up  subsequently   [facility] HospitalKUMAR,Siriah Treat 11/18/2014, 9:15 AM

## 2014-11-22 ENCOUNTER — Encounter (HOSPITAL_COMMUNITY): Payer: Self-pay | Admitting: *Deleted

## 2014-11-22 ENCOUNTER — Emergency Department (HOSPITAL_COMMUNITY): Payer: Medicaid Other

## 2014-11-22 ENCOUNTER — Emergency Department (HOSPITAL_COMMUNITY)
Admission: EM | Admit: 2014-11-22 | Discharge: 2014-11-22 | Disposition: A | Discharge status: Home / Self Care | Payer: Medicaid Other | Attending: Emergency Medicine | Admitting: Emergency Medicine

## 2014-11-22 DIAGNOSIS — K625 Hemorrhage of anus and rectum: Secondary | ICD-10-CM | POA: Diagnosis present

## 2014-11-22 DIAGNOSIS — Z79899 Other long term (current) drug therapy: Secondary | ICD-10-CM | POA: Diagnosis not present

## 2014-11-22 DIAGNOSIS — K529 Noninfective gastroenteritis and colitis, unspecified: Secondary | ICD-10-CM | POA: Insufficient documentation

## 2014-11-22 DIAGNOSIS — Z3202 Encounter for pregnancy test, result negative: Secondary | ICD-10-CM | POA: Diagnosis not present

## 2014-11-22 DIAGNOSIS — Z87891 Personal history of nicotine dependence: Secondary | ICD-10-CM | POA: Diagnosis not present

## 2014-11-22 DIAGNOSIS — E031 Congenital hypothyroidism without goiter: Secondary | ICD-10-CM | POA: Insufficient documentation

## 2014-11-22 DIAGNOSIS — R109 Unspecified abdominal pain: Secondary | ICD-10-CM

## 2014-11-22 LAB — URINALYSIS, ROUTINE W REFLEX MICROSCOPIC
Glucose, UA: NEGATIVE mg/dL
Hgb urine dipstick: NEGATIVE
KETONES UR: 15 mg/dL — AB
Leukocytes, UA: NEGATIVE
Nitrite: NEGATIVE
Protein, ur: 30 mg/dL — AB
SPECIFIC GRAVITY, URINE: 1.043 — AB (ref 1.005–1.030)
UROBILINOGEN UA: 1 mg/dL (ref 0.0–1.0)
pH: 6 (ref 5.0–8.0)

## 2014-11-22 LAB — CBC WITH DIFFERENTIAL/PLATELET
BASOS ABS: 0 10*3/uL (ref 0.0–0.1)
Basophils Relative: 0 % (ref 0–1)
EOS ABS: 0.1 10*3/uL (ref 0.0–0.7)
EOS PCT: 2 % (ref 0–5)
HEMATOCRIT: 38.4 % (ref 36.0–46.0)
Hemoglobin: 13.2 g/dL (ref 12.0–15.0)
Lymphocytes Relative: 28 % (ref 12–46)
Lymphs Abs: 1.3 10*3/uL (ref 0.7–4.0)
MCH: 30.3 pg (ref 26.0–34.0)
MCHC: 34.4 g/dL (ref 30.0–36.0)
MCV: 88.1 fL (ref 78.0–100.0)
Monocytes Absolute: 0.8 10*3/uL (ref 0.1–1.0)
Monocytes Relative: 17 % — ABNORMAL HIGH (ref 3–12)
Neutro Abs: 2.5 10*3/uL (ref 1.7–7.7)
Neutrophils Relative %: 53 % (ref 43–77)
Platelets: 157 10*3/uL (ref 150–400)
RBC: 4.36 MIL/uL (ref 3.87–5.11)
RDW: 12.9 % (ref 11.5–15.5)
WBC: 4.7 10*3/uL (ref 4.0–10.5)

## 2014-11-22 LAB — URINE MICROSCOPIC-ADD ON

## 2014-11-22 LAB — BASIC METABOLIC PANEL
Anion gap: 12 (ref 5–15)
BUN: 13 mg/dL (ref 6–23)
CALCIUM: 9.3 mg/dL (ref 8.4–10.5)
CO2: 25 meq/L (ref 19–32)
CREATININE: 0.51 mg/dL (ref 0.50–1.10)
Chloride: 103 mEq/L (ref 96–112)
GFR calc Af Amer: >90 mL/min (ref >90)
Glucose, Bld: 90 mg/dL (ref 70–99)
Potassium: 3.8 mEq/L (ref 3.7–5.3)
Sodium: 140 mEq/L (ref 137–147)

## 2014-11-22 LAB — PREGNANCY, URINE: Preg Test, Ur: NEGATIVE

## 2014-11-22 LAB — SEDIMENTATION RATE: SED RATE: 4 mm/h (ref 0–22)

## 2014-11-22 MED ORDER — METRONIDAZOLE 500 MG PO TABS: 500.0000 mg | ORAL_TABLET | Freq: Two times a day (BID) | ORAL | Status: DC

## 2014-11-22 MED ORDER — KETOROLAC TROMETHAMINE 30 MG/ML IJ SOLN
30.0000 mg | Freq: Once | INTRAMUSCULAR | Status: AC
  Administered 2014-11-22: 30 mg via INTRAVENOUS
  Filled 2014-11-22: qty 1

## 2014-11-22 MED ORDER — IOHEXOL 300 MG/ML  SOLN
25.0000 mL | Freq: Once | INTRAMUSCULAR | Status: AC | PRN
  Administered 2014-11-22: 25 mL via ORAL

## 2014-11-22 MED ORDER — IOHEXOL 300 MG/ML  SOLN
100.0000 mL | Freq: Once | INTRAMUSCULAR | Status: AC | PRN
Start: 2014-11-22 — End: 2014-11-22
  Administered 2014-11-22: 100 mL via INTRAVENOUS

## 2014-11-22 MED ORDER — GLYCOPYRROLATE 0.2 MG/ML IJ SOLN
0.1000 mg | Freq: Once | INTRAMUSCULAR | Status: AC
  Administered 2014-11-22: 0.1 mg via INTRAVENOUS
  Filled 2014-11-22: qty 1

## 2014-11-22 MED ORDER — SODIUM CHLORIDE 0.9 % IV SOLN
Freq: Once | INTRAVENOUS | Status: AC
  Administered 2014-11-22: 12:00:00 via INTRAVENOUS

## 2014-11-22 MED ORDER — METRONIDAZOLE 500 MG PO TABS
500.0000 mg | ORAL_TABLET | Freq: Once | ORAL | Status: AC
  Administered 2014-11-22: 500 mg via ORAL
  Filled 2014-11-22: qty 1

## 2014-11-22 MED ORDER — CLINDAMYCIN HCL 300 MG PO CAPS: 300.0000 mg | ORAL_CAPSULE | Freq: Two times a day (BID) | ORAL | Status: DC

## 2014-11-22 MED ORDER — SODIUM CHLORIDE 0.9 % IV BOLUS (SEPSIS)
500.0000 mL | Freq: Once | INTRAVENOUS | Status: AC
  Administered 2014-11-22: 500 mL via INTRAVENOUS

## 2014-11-22 MED ORDER — CLINDAMYCIN HCL 300 MG PO CAPS
300.0000 mg | ORAL_CAPSULE | Freq: Once | ORAL | Status: AC
  Administered 2014-11-22: 300 mg via ORAL
  Filled 2014-11-22: qty 2
  Filled 2014-11-22: qty 1

## 2014-11-22 NOTE — ED Provider Notes (Signed)
CSN: [id number]     Arrival date & time 11/22/14  1040 History   First MD Initiated Contact with Patient 11/22/14 1051     Chief Complaint  Patient presents with  . Rectal Bleeding     HPI  His wrist evaluation of abdominal pain, diarrhea, and blood in stools. Has been abdominal cramping yesterday states she felt like she was maybe going to start a period. She had a child 3 months ago and has not had return of menses.. However, today she had an episode of loose stool with blood. She states was bright red .  No perirectal pain. No hemorrhoids during her pregnancy or palpable masses sent. No history of known colitis. No foreign local travel. No fevers chills. Cramping and nausea yesterday followed by the episodes today.   Past Medical History  Diagnosis Date  . Congenital hypothyroidism   . PONV (postoperative nausea and vomiting)    Past Surgical History  Procedure Laterality Date  . Abdominal hernia repair  1993, 1997  . Oophorectomy Right 09/2013    ovarian cysts   No family history on file. History  Substance Use Topics  . Smoking status: Former Smoker    Types: Cigarettes  . Smokeless tobacco: Never Used  . Alcohol Use: No   OB History    Gravida Para Term Preterm AB TAB SAB Ectopic Multiple Living   3 3 3       3      Review of Systems  Constitutional: Negative for fever, chills, diaphoresis, appetite change and fatigue.  HENT: Negative for mouth sores, sore throat and trouble swallowing.   Eyes: Negative for visual disturbance.  Respiratory: Negative for cough, chest tightness, shortness of breath and wheezing.   Cardiovascular: Negative for chest pain.  Gastrointestinal: Positive for abdominal pain, blood in stool and anal bleeding. Negative for nausea, vomiting, diarrhea and abdominal distention.  Endocrine: Negative for polydipsia, polyphagia and polyuria.  Genitourinary: Negative for dysuria, frequency and hematuria.  Musculoskeletal: Negative for gait  problem.  Skin: Negative for color change, pallor and rash.  Neurological: Negative for dizziness, syncope, light-headedness and headaches.  Hematological: Does not bruise/bleed easily.  Psychiatric/Behavioral: Negative for behavioral problems and confusion.      Allergies  Penicillins and Sulfa antibiotics  Home Medications   Prior to Admission medications   Medication Sig Start Date End Date Taking? Authorizing Provider  flintstones complete (FLINTSTONES) 60 MG chewable tablet Chew 1 tablet by mouth daily.   Yes Historical Provider, MD  ibuprofen (ADVIL,MOTRIN) 600 MG tablet Take 600 mg by mouth every 6 (six) hours as needed for moderate pain.   Yes Historical Provider, MD  levothyroxine (SYNTHROID, LEVOTHROID) 150 MCG tablet Take 1 tablet (150 mcg total) by mouth daily before breakfast. 10/02/14  Yes Reather Littler, MD  clindamycin (CLEOCIN) 300 MG capsule Take 1 capsule (300 mg total) by mouth 2 (two) times daily. 11/22/14   Rolland Porter, MD  metroNIDAZOLE (FLAGYL) 500 MG tablet Take 1 tablet (500 mg total) by mouth 2 (two) times daily. 11/22/14   Rolland Porter, MD   BP 98/70 mmHg  Pulse 60  Temp(Src) 97.9 F (36.6 C) (Oral)  Resp 22  Ht 5' 2.5" (1.588 m)  Wt 125 lb (56.7 kg)  BMI 22.48 kg/m2  SpO2 100%  Breastfeeding? Yes Physical Exam  Constitutional: She is oriented to person, place, and time. She appears well-developed and well-nourished. No distress.  HENT:  Head: Normocephalic.  Eyes: Conjunctivae are normal. Pupils are equal, round,  and reactive to light. No scleral icterus.  Neck: Normal range of motion. Neck supple. No thyromegaly present.  Cardiovascular: Normal rate and regular rhythm.  Exam reveals no gallop and no friction rub.   No murmur heard. Pulmonary/Chest: Effort normal and breath sounds normal. No respiratory distress. She has no wheezes. She has no rales.  Abdominal: Soft. Bowel sounds are normal. She exhibits no distension. There is no tenderness. There is  no rebound.    Genitourinary:     Musculoskeletal: Normal range of motion.  Neurological: She is alert and oriented to person, place, and time.  Skin: Skin is warm and dry. No rash noted.  Psychiatric: She has a normal mood and affect. Her behavior is normal.    ED Course  Procedures (including critical care time) Labs Review Labs Reviewed  CBC WITH DIFFERENTIAL - Abnormal; Notable for the following:    Monocytes Relative 17 (*)    All other components within normal limits  URINALYSIS, ROUTINE W REFLEX MICROSCOPIC - Abnormal; Notable for the following:    Color, Urine AMBER (*)    APPearance CLOUDY (*)    Specific Gravity, Urine 1.043 (*)    Bilirubin Urine SMALL (*)    Ketones, ur 15 (*)    Protein, ur 30 (*)    All other components within normal limits  URINE MICROSCOPIC-ADD ON - Abnormal; Notable for the following:    Squamous Epithelial / LPF FEW (*)    All other components within normal limits  BASIC METABOLIC PANEL  PREGNANCY, URINE  SEDIMENTATION RATE    Imaging Review Ct Abdomen Pelvis W Contrast  11/22/2014   CLINICAL DATA:  Weakness, diffuse abdominal pain, rectal bleeding. Three months postpartum.  EXAM: CT ABDOMEN AND PELVIS WITH CONTRAST  TECHNIQUE: Multidetector CT imaging of the abdomen and pelvis was performed using the standard protocol following bolus administration of intravenous contrast.  CONTRAST:  100mL OMNIPAQUE IOHEXOL 300 MG/ML  SOLN  COMPARISON:  None.  FINDINGS: Lower chest:  Lung bases are clear.  Hepatobiliary: Liver is within normal limits.  Gallbladder is unremarkable. No intrahepatic or extrahepatic ductal dilatation.  Pancreas: Within normal limits.  Spleen: Within normal limits.  Adrenals/Urinary Tract: Adrenal glands are unremarkable.  Kidneys are within normal limits.  No hydronephrosis.  Bladder is within normal limits.  Stomach/Bowel: Stomach is unremarkable.  No evidence of bowel obstruction.  Normal appendix.  Mild to moderate colonic  stool burden.  No rectal wall thickening/mass.  Vascular/Lymphatic: No evidence of abdominal aortic aneurysm.  No suspicious abdominopelvic lymphadenopathy.  Reproductive: Uterus is unremarkable.  Left ovary is within normal limits.  No right adnexal mass.  Other: No abdominopelvic ascites.  Musculoskeletal: Visualized osseous structures are within normal limits.  IMPRESSION: No evidence of bowel obstruction.  Normal appendix.  No rectal wall thickening or mass is evident by CT.  No CT findings to account for the patient's abdominal pain.   Electronically Signed   By: Charline BillsSriyesh  Krishnan M.D.   On: 11/22/2014 13:40     EKG Interpretation None      MDM   Final diagnoses:  Abdominal pain  Rectal bleeding  Colitis    Patient with node abnormality is on CT. Bloody stool with her episode of diarrhea. No sign of hemorrhoid or fissure. I think this is likely an infectious colitis. Negative septum sedimentation rate and no abnormalities of bowel on imaging. Considering she is breast-feeding allergic to penicillins plan will be use of clindamycin, Flagyl. Primary care follow-up.  Rolland PorterMark Neo Yepiz, MD 11/22/14 [phone]

## 2014-11-22 NOTE — ED Notes (Signed)
Rhea in lab to add on Sed rate to blood already in lab.

## 2014-11-22 NOTE — ED Notes (Signed)
Drew and held blood

## 2014-11-22 NOTE — Discharge Instructions (Signed)
Colitis °Colitis is inflammation of the colon. Colitis can be a short-term or long-standing (chronic) illness. Crohn's disease and ulcerative colitis are 2 types of colitis which are chronic. They usually require lifelong treatment. °CAUSES  °There are many different causes of colitis, including: °· Viruses. °· Germs (bacteria). °· Medicine reactions. °SYMPTOMS  °· Diarrhea. °· Intestinal bleeding. °· Pain. °· Fever. °· Throwing up (vomiting). °· Tiredness (fatigue). °· Weight loss. °· Bowel blockage. °DIAGNOSIS  °The diagnosis of colitis is based on examination and stool or blood tests. X-rays, CT scan, and colonoscopy may also be needed. °TREATMENT  °Treatment may include: °· Fluids given through the vein (intravenously). °· Bowel rest (nothing to eat or drink for a period of time). °· Medicine for pain and diarrhea. °· Medicines (antibiotics) that kill germs. °· Cortisone medicines. °· Surgery. °HOME CARE INSTRUCTIONS  °· Get plenty of rest. °· Drink enough water and fluids to keep your urine clear or pale yellow. °· Eat a well-balanced diet. °· Call your caregiver for follow-up as recommended. °SEEK IMMEDIATE MEDICAL CARE IF:  °· You develop chills. °· You have an oral temperature above 102° F (38.9° C), not controlled by medicine. °· You have extreme weakness, fainting, or dehydration. °· You have repeated vomiting. °· You develop severe belly (abdominal) pain or are passing bloody or tarry stools. °MAKE SURE YOU:  °· Understand these instructions. °· Will watch your condition. °· Will get help right away if you are not doing well or get worse. °Document Released: 12/28/2004 Document Revised: 02/12/2012 Document Reviewed: 03/25/2010 °Exit[employer]® Patient Information ©2015 [employer]. This information is not intended to replace advice given to you by your health care provider. Make sure you discuss any questions you have with your health care provider. ° °Bloody Stools °Bloody stools often mean that there is a  problem in the digestive tract. Your caregiver may use the term "melena" to describe black, tarry, and bad smelling stools or "hematochezia" to describe red or maroon-colored stools. Blood seen in the stool can be caused by bleeding anywhere along the intestinal tract.  °A black stool usually means that blood is coming from the upper part of the gastrointestinal tract (esophagus, stomach, or small bowel). Passing maroon-colored stools or bright red blood usually means that blood is coming from lower down in the large bowel or the rectum. However, sometimes massive bleeding in the stomach or small intestine can cause bright red bloody stools.  °Consuming black licorice, lead, iron pills, medicines containing bismuth subsalicylate, or blueberries can also cause black stools. Your caregiver can test black stools to see if blood is present. °It is important that the cause of the bleeding be found. Treatment can then be started, and the problem can be corrected. Rectal bleeding may not be serious, but you should not assume everything is okay until you know the cause. It is very important to follow up with your caregiver or a specialist in gastrointestinal problems. °CAUSES  °Blood in the stools can come from various underlying causes. Often, the cause is not found during your first visit. Testing is often needed to discover the cause of bleeding in the gastrointestinal tract. Causes range from simple to serious or even life-threatening. Possible causes include: °· Hemorrhoids. These are veins that are full of blood (engorged) in the rectum. They cause pain, inflammation, and may bleed. °· Anal fissures. These are areas of painful tearing which may bleed. They are often caused by passing hard stool. °· Diverticulosis. These are pouches that form on the colon over   time, with age, and may bleed significantly. °· Diverticulitis. This is inflammation in areas with diverticulosis. It can cause pain, fever, and bloody stools,  although bleeding is rare. °· Proctitis and colitis. These are inflamed areas of the rectum or colon. They may cause pain, fever, and bloody stools. °· Polyps and cancer. Colon cancer is a leading cause of preventable cancer death. It often starts out as precancerous polyps that can be removed during a colonoscopy, preventing progression into cancer. Sometimes, polyps and cancer may cause rectal bleeding. °· Gastritis and ulcers. Bleeding from the upper gastrointestinal tract (near the stomach) may travel through the intestines and produce black, sometimes tarry, often bad smelling stools. In certain cases, if the bleeding is fast enough, the stools may not be black, but red and the condition may be life-threatening. °SYMPTOMS  °You may have stools that are bright red and bloody, that are normal color with blood on them, or that are dark black and tarry. In some cases, you may only have blood in the toilet bowl. Any of these cases need medical care. You may also have: °· Pain at the anus or anywhere in the rectum. °· Lightheadedness or feeling faint. °· Extreme weakness. °· Nausea or vomiting. °· Fever. °DIAGNOSIS °Your caregiver may use the following methods to find the cause of your bleeding: °· Taking a medical history. Age is important. Older people tend to develop polyps and cancer more often. If there is anal pain and a hard, large stool associated with bleeding, a tear of the anus may be the cause. If blood drips into the toilet after a bowel movement, bleeding hemorrhoids may be the problem. The color and frequency of the bleeding are additional considerations. In most cases, the medical history provides clues, but seldom the final answer. °· A visual and finger (digital) exam. Your caregiver will inspect the anal area, looking for tears and hemorrhoids. A finger exam can provide information when there is tenderness or a growth inside. In men, the prostate is also examined. °· Endoscopy. Several types of  small, long scopes (endoscopes) are used to view the colon. °¨ In the office, your caregiver may use a rigid, or more commonly, a flexible viewing sigmoidoscope. This exam is called flexible sigmoidoscopy. It is performed in 5 to 10 minutes. °¨ A more thorough exam is accomplished with a colonoscope. It allows your caregiver to view the entire 5 to 6 foot long colon. Medicine to help you relax (sedative) is usually given for this exam. Frequently, a bleeding lesion may be present beyond the reach of the sigmoidoscope. So, a colonoscopy may be the best exam to start with. Both exams are usually done on an outpatient basis. This means the patient does not stay overnight in the hospital or surgery center. °¨ An upper endoscopy may be needed to examine your stomach. Sedation is used and a flexible endoscope is put in your mouth, down to your stomach. °· A barium enema X-ray. This is an X-ray exam. It uses liquid barium inserted by enema into the rectum. This test alone may not identify an actual bleeding point. X-rays highlight abnormal shadows, such as those made by lumps (tumors), diverticuli, or colitis. °TREATMENT  °Treatment depends on the cause of your bleeding.  °· For bleeding from the stomach or colon, the caregiver doing your endoscopy or colonoscopy may be able to stop the bleeding as part of the procedure. °· Inflammation or infection of the colon can be treated with medicines. °· Many rectal problems   can be treated with creams, suppositories, or warm baths. °· Surgery is sometimes needed. °· Blood transfusions are sometimes needed if you have lost a lot of blood. °· For any bleeding problem, let your caregiver know if you take aspirin or other blood thinners regularly. °HOME CARE INSTRUCTIONS  °· Take any medicines exactly as prescribed. °· Keep your stools soft by eating a diet high in fiber. Prunes (1 to 3 a day) work well for many people. °· Drink enough water and fluids to keep your urine clear or pale  yellow. °· Take sitz baths if advised. A sitz bath is when you sit in a bathtub with warm water for 10 to 15 minutes to soak, soothe, and cleanse the rectal area. °· If enemas or suppositories are advised, be sure you know how to use them. Tell your caregiver if you have problems with this. °· Monitor your bowel movements to look for signs of improvement or worsening. °SEEK MEDICAL CARE IF:  °· You do not improve in the time expected. °· Your condition worsens after initial improvement. °· You develop any new symptoms. °SEEK IMMEDIATE MEDICAL CARE IF:  °· You develop severe or prolonged rectal bleeding. °· You vomit blood. °· You feel weak or faint. °· You have a fever. °MAKE SURE YOU: °· Understand these instructions. °· Will watch your condition. °· Will get help right away if you are not doing well or get worse. °Document Released: 11/10/2002 Document Revised: 02/12/2012 Document Reviewed: 04/07/2011 °ExitCare® Patient Information ©2015 [employer]. This information is not intended to replace advice given to you by your health care provider. Make sure you discuss any questions you have with your health care provider. ° °

## 2014-11-22 NOTE — ED Notes (Signed)
Pt has been feeling badly since yesterday, she states that she has had some blood while wiping for a while but yesterday she had stomach cramping and was feeling weak and lightheaded and was having a really bad headache.  She then went to the bathroom and had a BM and the whole toilet was filled with blood.  Pt continues to have abdominal pain

## 2014-11-23 ENCOUNTER — Telehealth: Payer: Self-pay

## 2014-11-23 NOTE — Telephone Encounter (Signed)
Message left by patient stating that she went to the ER yesterday and given two antibiotics for an intestinal infection.  Pt stated that she picked up medication and it states not to use if breastfeeding.  Pt said she also asked the pharmacist and he recommended not to take it either.   Called pt and informed her that per provider, Dr. Jolayne Pantheronstant, she states that the baby does get some of the medication in the breast milk but that it is safe for baby.  Pt stated understanding.

## 2014-11-24 ENCOUNTER — Emergency Department (HOSPITAL_COMMUNITY)
Admission: EM | Admit: 2014-11-24 | Discharge: 2014-11-24 | Disposition: A | Discharge status: Home / Self Care | Payer: Medicaid Other | Attending: Emergency Medicine | Admitting: Emergency Medicine

## 2014-11-24 ENCOUNTER — Encounter (HOSPITAL_COMMUNITY): Payer: Self-pay | Admitting: *Deleted

## 2014-11-24 DIAGNOSIS — H018 Other specified inflammations of eyelid: Secondary | ICD-10-CM | POA: Insufficient documentation

## 2014-11-24 DIAGNOSIS — E031 Congenital hypothyroidism without goiter: Secondary | ICD-10-CM | POA: Diagnosis not present

## 2014-11-24 DIAGNOSIS — Z880 Allergy status to penicillin: Secondary | ICD-10-CM | POA: Diagnosis not present

## 2014-11-24 DIAGNOSIS — K625 Hemorrhage of anus and rectum: Secondary | ICD-10-CM | POA: Insufficient documentation

## 2014-11-24 DIAGNOSIS — Z87891 Personal history of nicotine dependence: Secondary | ICD-10-CM | POA: Insufficient documentation

## 2014-11-24 DIAGNOSIS — Z79899 Other long term (current) drug therapy: Secondary | ICD-10-CM | POA: Insufficient documentation

## 2014-11-24 DIAGNOSIS — H5712 Ocular pain, left eye: Secondary | ICD-10-CM | POA: Diagnosis present

## 2014-11-24 DIAGNOSIS — H019 Unspecified inflammation of eyelid: Secondary | ICD-10-CM

## 2014-11-24 NOTE — ED Provider Notes (Signed)
CSN: [id number]     Arrival date & time 11/24/14  1545 History  This chart was scribed for non-physician practitioner, Mellody DrownLauren Linwood Gullikson, PA-C, working with Gilda Creasehristopher J. Pollina, *, by Bronson CurbJacqueline Melvin, ED Scribe. This patient was seen in room TR04C/TR04C and the patient's care was started at 5:18 PM.    Chief Complaint  Patient presents with  . Eye Pain  . Abdominal Pain     HPI Comments: Paula French is a 28 y.o. female who presents to the Emergency Department complaining of sudden onset constant left eye pain that began yesterday, and has gotten worse today. Patient was seen 2 days ago, and was given ABX (Clindamycin and Flagyl), and suspects this is the cause of her symptoms. There is associated left eyelid swelling, redness, and blurry vision. Relates blurry vision with eyelid discomfort. She reports the pain is worse near the bridge of her nose and with movement. Patient has history of styes, but states this does not feel similar. She states she rarely wear makeup, but reports she was wearing mascara 2 days ago prior to onset of pain. She denies drainage. Patient does wear glasses. She is not established with an eye specialist in [place] and does not wear contacts.    Patient also notes lower and left sided abdominal pain that is worse after eating or drinking. She also notes rectal bleeding that has been ongoing for a month, but has gotten worse in the past 2 days. She denies history of IBS. She reports past surgical history of right oophorectomy last year, but is not sure if this is related. She denies diarrhea.  The history is provided by the patient. No language interpreter was used.    Past Medical History  Diagnosis Date  . Congenital hypothyroidism   . PONV (postoperative nausea and vomiting)    Past Surgical History  Procedure Laterality Date  . Abdominal hernia repair  1993, 1997  . Oophorectomy Right 09/2013    ovarian cysts   History reviewed. No pertinent family  history. History  Substance Use Topics  . Smoking status: Former Smoker    Types: Cigarettes  . Smokeless tobacco: Never Used  . Alcohol Use: No   OB History    Gravida Para Term Preterm AB TAB SAB Ectopic Multiple Living   3 3 3       3      Review of Systems  Eyes: Positive for pain and visual disturbance. Negative for discharge and itching.  Gastrointestinal: Positive for abdominal pain. Negative for diarrhea.      Allergies  Penicillins and Sulfa antibiotics  Home Medications   Prior to Admission medications   Medication Sig Start Date End Date Taking? Authorizing Provider  clindamycin (CLEOCIN) 300 MG capsule Take 1 capsule (300 mg total) by mouth 2 (two) times daily. 11/22/14   Rolland PorterMark James, MD  flintstones complete (FLINTSTONES) 60 MG chewable tablet Chew 1 tablet by mouth daily.    Historical Provider, MD  ibuprofen (ADVIL,MOTRIN) 600 MG tablet Take 600 mg by mouth every 6 (six) hours as needed for moderate pain.    Historical Provider, MD  levothyroxine (SYNTHROID, LEVOTHROID) 150 MCG tablet Take 1 tablet (150 mcg total) by mouth daily before breakfast. 10/02/14   Reather LittlerAjay Kumar, MD  metroNIDAZOLE (FLAGYL) 500 MG tablet Take 1 tablet (500 mg total) by mouth 2 (two) times daily. 11/22/14   Rolland PorterMark James, MD   Triage Vitals: BP 111/71 mmHg  Pulse 66  Temp(Src) 98.3 F (36.8 C) (Oral)  Resp 16  SpO2 97%  Physical Exam  Constitutional: She is oriented to person, place, and time. She appears well-developed and well-nourished. No distress.  HENT:  Head: Normocephalic and atraumatic.  Eyes: Conjunctivae and EOM are normal. Pupils are equal, round, and reactive to light. Right eye exhibits no chemosis, no discharge and no exudate. Left eye exhibits no chemosis, no discharge and no exudate. Right conjunctiva is not injected. Left conjunctiva is not injected.  Right eye. Mild swelling of upper eyelid, with increase in swelling over the medial aspect. No obvious pointing.   Neck:  Neck supple.  Pulmonary/Chest: Effort normal. No respiratory distress.  Abdominal: Soft. She exhibits no mass. There is tenderness. There is no rebound and no guarding.  Mild left sided abdominal discomfort with palpation.  Musculoskeletal: Normal range of motion.  Neurological: She is alert and oriented to person, place, and time.  Skin: Skin is warm and dry.  Psychiatric: She has a normal mood and affect. Her behavior is normal.  Nursing note and vitals reviewed.   ED Course  Procedures (including critical care time)   COORDINATION OF CARE: At 1726 Discussed treatment plan with patient which includes warm compresses, f/u with primary care and eye specialist. Patient agrees.   Labs Review Labs Reviewed - No data to display  Imaging Review No results found.   EKG Interpretation None      MDM   Final diagnoses:  Infected eye lid   Patient with left eye pain. Exam shows mild lid edema with erythema. No sign of periorbital cellulitis. Patient is on clindamycin at this time. Likely early stye. Plan to treat with pain medication, warm compress, follow up with eye specialist and primary care doctor. Patient reports persistent abdominal pain, recently seen and treated for colitis, reports compliance with clindamycin and metronidazole.  Exam shows mild left sided abdominal discomfort with palpation. Patient is nontoxic, afebrile, no vomiting or diarrhea. Plan to treat for pain, follow-up with GI specialist.  Discussed that normal, however it is unsafe during breast-feeding.  I personally performed the services described in this documentation, which was scribed in my presence. The recorded information has been reviewed and is accurate.    Mellody DrownLauren Deloras Reichard, PA-C 11/24/14 1852  Gilda Creasehristopher J. Pollina, MD 11/25/14 [phone]

## 2014-11-24 NOTE — ED Notes (Signed)
Pt c/o left eye swelling, pain, and blurry vision. Pt denies any injury to left eye. Pt also c/o abdominal pain. Pt was seen here on Sunday, dx with colitis, given a script for flagyl and clindamycin.

## 2014-11-24 NOTE — Discharge Instructions (Signed)
Call for a follow up appointment with a eye specialist for further evaluation of your eye pain and swelling. Follow-up with a GI specialist for further evaluation of your persistent abdominal discomfort. Use a warm compress on your eye lid 3-4 times a day. Return if Symptoms worsen.   Take medication as prescribed.

## 2014-12-25 IMAGING — US US OB DETAIL+14 WK
1 series · 12 of 28 positions shown · non-contrast
Comparison: none

[Series 1: us ob comp +14 wk mfm · 12 of 66 slices shown]
[im 3/66]
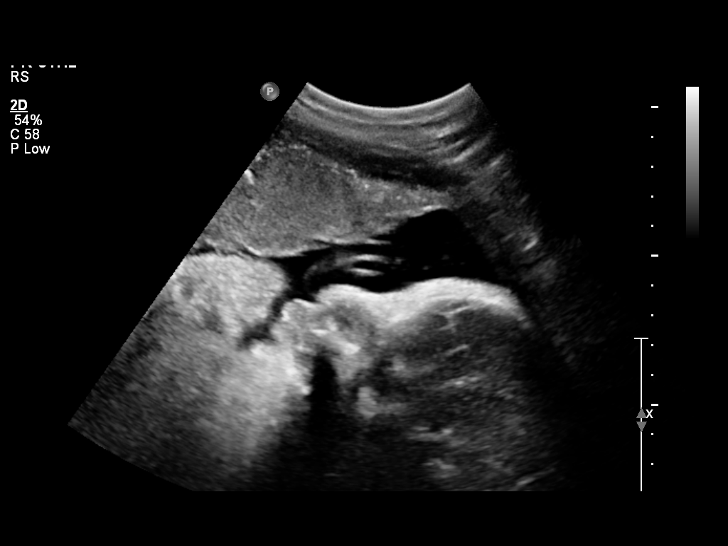
[im 8/66]
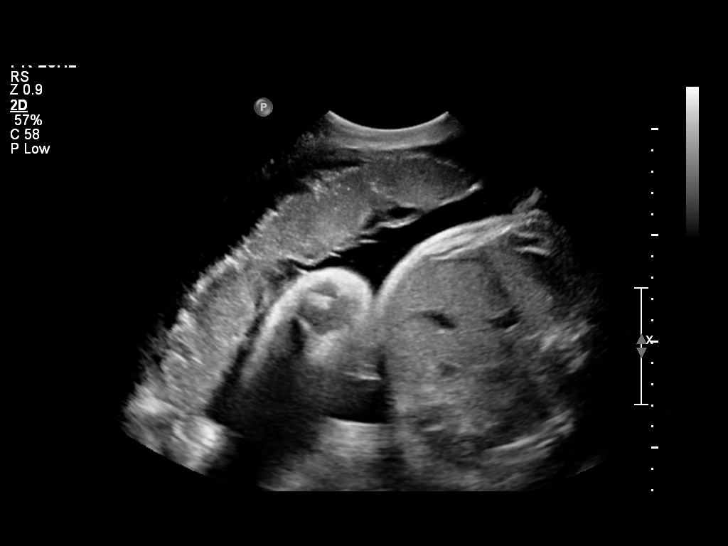
[im 13/66]
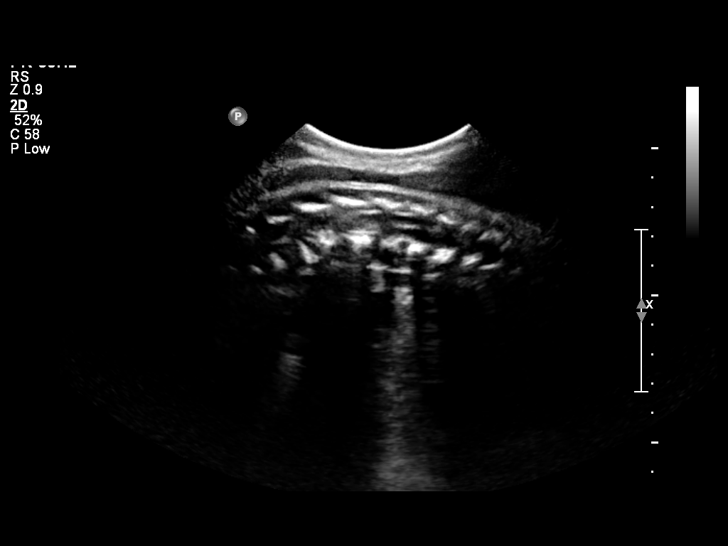
[im 20/66]
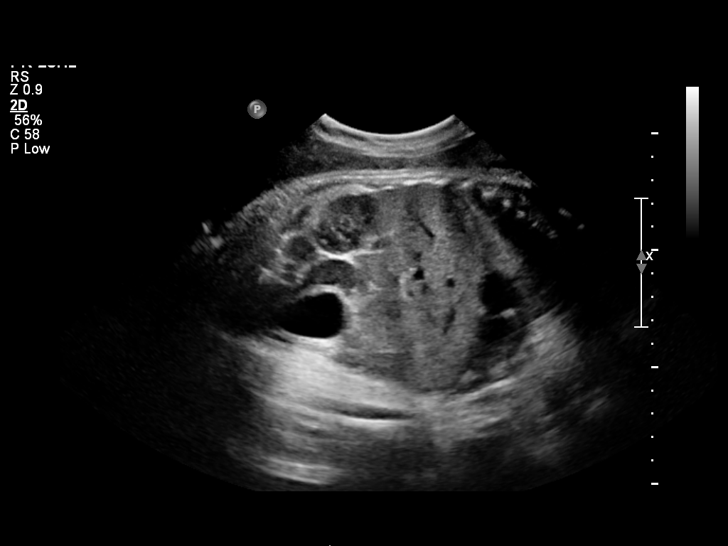
[im 25/66]
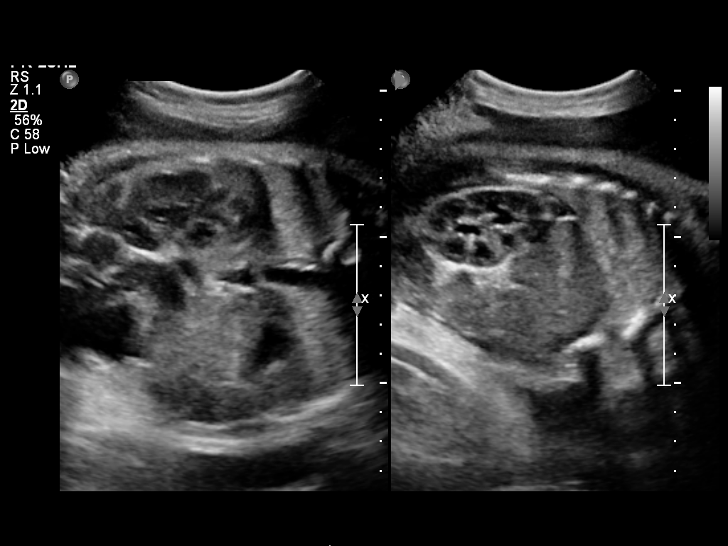
[im 29/66]
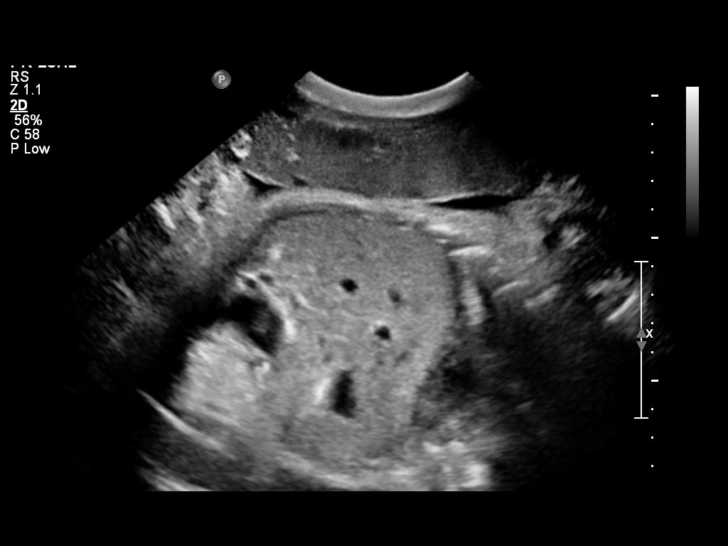
[im 37/66]
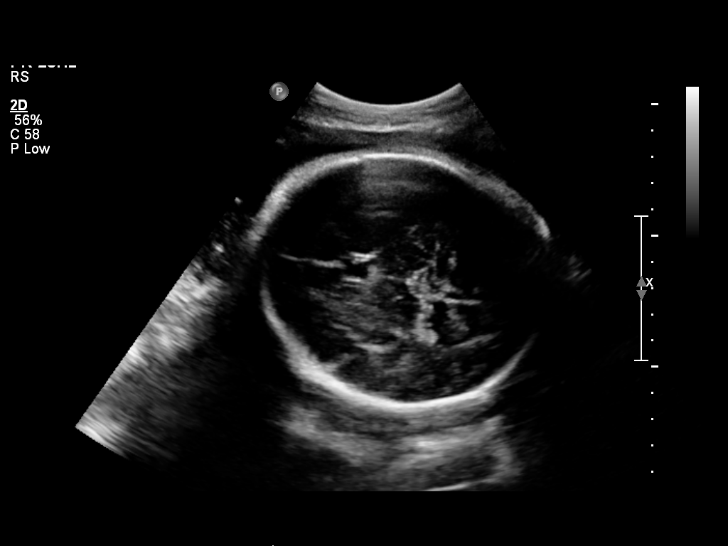
[im 41/66]
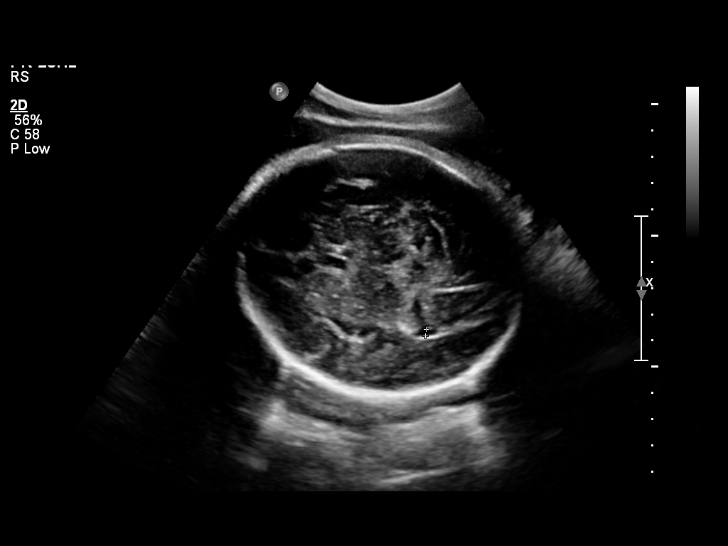
[im 46/66]
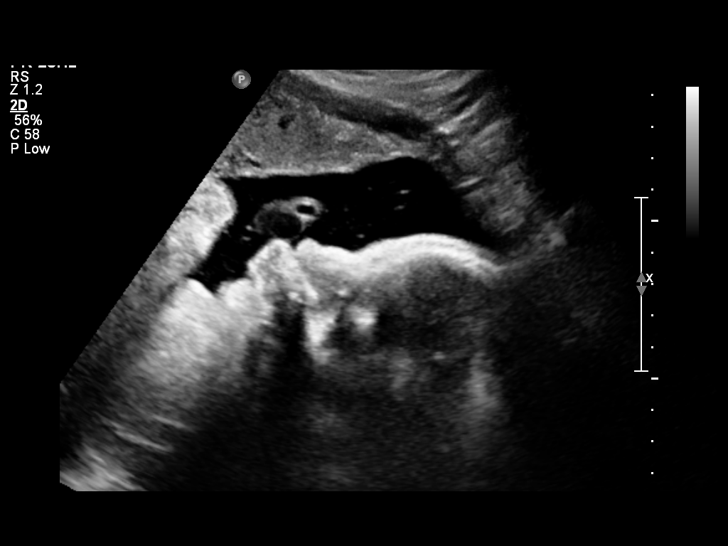
[im 53/66]
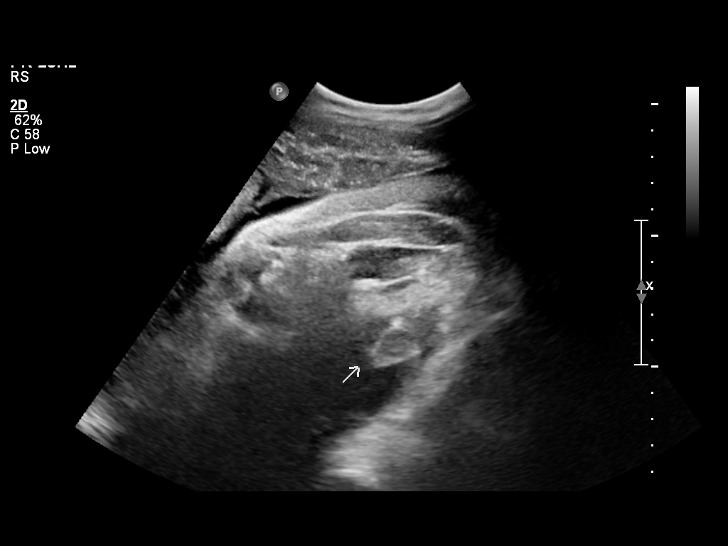
[im 58/66]
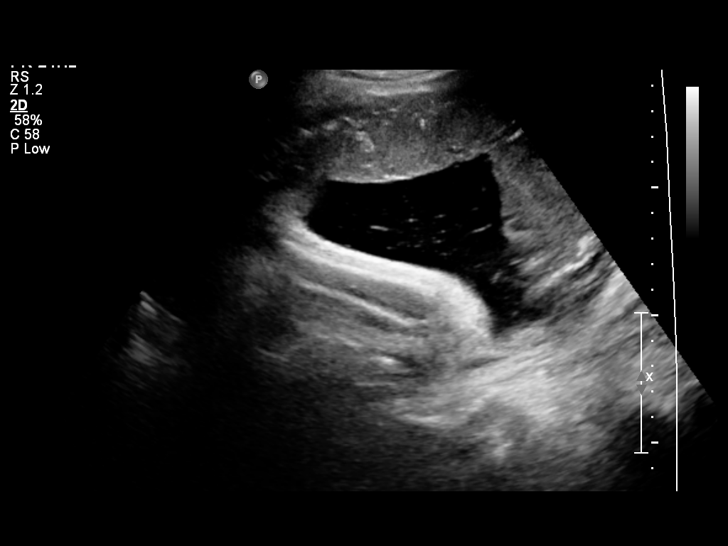
[im 63/66]
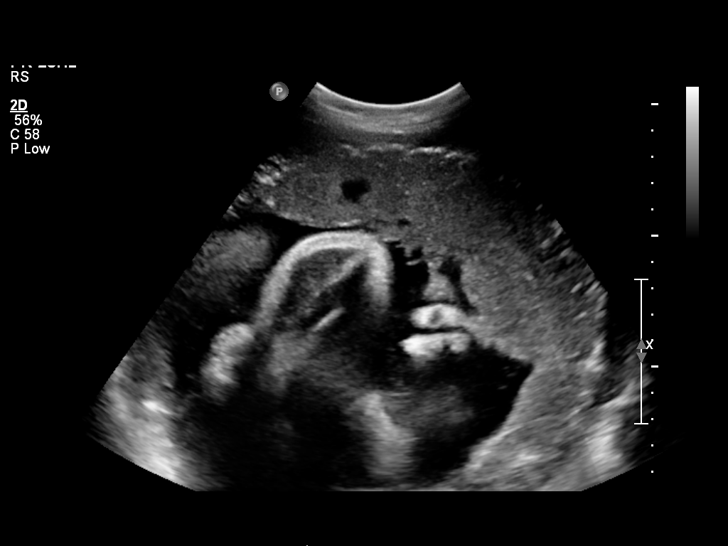

[12 of 28 positions shown; findings below may reference images not displayed]

OBSTETRICS REPORT
                      (Signed Final 08/11/2014 [DATE])

Service(s) Provided

 US OB DETAIL + 14 WK                                  76811.0
Indications

 Detailed fetal anatomic survey
 Size less than dates (Small for gestational [AGE]
 Hypothyroid - congenital (Synthroid)
 Poor weight gain
 Choroid plexus cyst - normal NIPS
 Echogenic intracardiac focus of the heart  (EIF) -
 normal NIPS ([facility])
 Rh negative
Fetal Evaluation

 Num Of Fetuses:    1
 Fetal Heart Rate:  133                          bpm
 Cardiac Activity:  Observed
 Presentation:      Cephalic
 Placenta:          Anterior, above cervical os
 P. Cord            Visualized, central
 Insertion:

 Amniotic Fluid
 AFI FV:      Subjectively within normal limits
 AFI Sum:     23.27   cm       92  %Tile     Larg Pckt:    7.82  cm
 RUQ:   7.82    cm   RLQ:    6.03   cm    LUQ:   7.34    cm   LLQ:    2.08   cm
Biometry

 BPD:     91.2  mm     G. Age:  37w 0d                CI:        75.87   70 - 86
                                                      FL/HC:      21.1   20.9 -

 HC:     331.9  mm     G. Age:  37w 6d       29  %    HC/AC:      0.97   0.92 -

 AC:     342.3  mm     G. Age:  38w 1d       74  %    FL/BPD:     76.8   71 - 87
 FL:        70  mm     G. Age:  35w 6d       10  %    FL/AC:      20.4   20 - 24

 Est. FW:    0890  gm      7 lb 1 oz     68  %
Gestational Age

 LMP:           37w 6d        Date:  11/19/13                 EDD:   08/26/14
 U/S Today:     37w 2d                                        EDD:   08/30/14
 Best:          37w 6d     Det. By:  LMP  (11/19/13)          EDD:   08/26/14
Anatomy

 Cranium:          Appears normal         Aortic Arch:      Not well visualized
 Fetal Cavum:      Appears normal         Ductal Arch:      Not well visualized
 Ventricles:       Appears normal         Diaphragm:        Appears normal
 Choroid Plexus:   Appears normal         Stomach:          Appears normal, left
                                                            sided
 Cerebellum:       Appears normal         Abdomen:          Appears normal
 Posterior Fossa:  Appears normal         Abdominal Wall:   Not well visualized
 Nuchal Fold:      Not applicable (>20    Cord Vessels:     Appears normal (3
                   wks GA)                                  vessel cord)
 Face:             Appears normal         Kidneys:          Appear normal
                   (orbits and profile)
 Lips:             Appears normal         Bladder:          Appears normal
 Heart:            Appears normal         Spine:            Appears normal
                   (4CH, axis, and
                   situs)
 RVOT:             Appears normal         Lower             Visualized
                                          Extremities:
 LVOT:             Appears normal         Upper             Visualized
                                          Extremities:

 Other:  5th digit visualized. Fetus appears to be a male. Nasal bone
         visualized. Technically difficult due to advanced gestational age.
Cervix Uterus Adnexa

 Cervix:       Not visualized (advanced GA >38wks)
 Uterus:       No abnormality visualized.
 Cul De Sac:   No free fluid seen.
 Left Ovary:    Not visualized.
 Right Ovary:   Not visualized.

 Adnexa:     No abnormality visualized.
Impression

 SIUP at 63w0d
 EFW 68th%'le
 No dysmorphic features with limitations as documented above
 No previa
Recommendations

 Follow up ultrasounds as clinically indicated.

 questions or concerns.

## 2015-01-08 ENCOUNTER — Ambulatory Visit (INDEPENDENT_AMBULATORY_CARE_PROVIDER_SITE_OTHER): Payer: Medicaid Other | Admitting: Obstetrics and Gynecology

## 2015-01-08 DIAGNOSIS — IMO0002 Reserved for concepts with insufficient information to code with codable children: Secondary | ICD-10-CM

## 2015-01-08 DIAGNOSIS — N941 Dyspareunia: Secondary | ICD-10-CM

## 2015-01-08 DIAGNOSIS — Z3202 Encounter for pregnancy test, result negative: Secondary | ICD-10-CM

## 2015-01-08 DIAGNOSIS — N926 Irregular menstruation, unspecified: Secondary | ICD-10-CM

## 2015-01-08 LAB — POCT PREGNANCY, URINE: PREG TEST UR: NEGATIVE

## 2015-01-08 NOTE — Progress Notes (Signed)
Patient ID: Paula French, female   DOB: 06-01-1986, 29 y.o.   MRN: [id number] 29 yo G3P3 with breastfeeding induced amenorrhea presenting today for further evaluation of her dyspareunia. Patient had a laporascopic RSO in 2014 which indicated the presence of endometriosis. Patient states that her pain has improved during her postpartum period while she is breastfeeding but is still present. She describes her pain as being internal in her lower pelvis, stabbing pain with penetration. She states the pain was present prior to her 2014 surgery and is still present now but in a milder form. She has tried OCPs in the past but did not like their effects.  Past Medical History  Diagnosis Date  . Congenital hypothyroidism   . PONV (postoperative nausea and vomiting)    Past Surgical History  Procedure Laterality Date  . Abdominal hernia repair  1993, 1997  . Oophorectomy Right 09/2013    ovarian cysts   No family history on file. History  Substance Use Topics  . Smoking status: Former Smoker    Types: Cigarettes  . Smokeless tobacco: Never Used  . Alcohol Use: No   GENERAL: Well-developed, well-nourished female in no acute distress.  ABDOMEN: Soft, nontender, nondistended. No organomegaly. PELVIC: Normal external female genitalia. Vagina is pink and rugated.  Normal discharge. Normal appearing cervix. Uterus is normal in size. No adnexal mass. Diffuse tenderness on exam EXTREMITIES: No cyanosis, clubbing, or edema, 2+ distal pulses.  A/P 29 yo with dyspareunia suspect related to endometriosis - Discussed medical management with birth control or Lupron. Patient is not interested in medical management - Patient is too young to consider oophorectomy. - Patient agrees to refer to pain clinic for further evaluation

## 2015-01-08 NOTE — Progress Notes (Signed)
Here for follow up today after having received operative notes from [facility]. Patient continues to report painful sex. Has not had a period since before became pregnant-- reports having unprotected sex a few times since delivery-- requests to have UPT to confirm she is not pregnant.

## 2015-01-26 ENCOUNTER — Other Ambulatory Visit: Payer: Self-pay | Admitting: Endocrinology

## 2015-03-01 ENCOUNTER — Other Ambulatory Visit: Payer: Self-pay | Admitting: Endocrinology

## 2015-05-17 ENCOUNTER — Other Ambulatory Visit (INDEPENDENT_AMBULATORY_CARE_PROVIDER_SITE_OTHER): Payer: Medicaid Other

## 2015-05-17 DIAGNOSIS — E031 Congenital hypothyroidism without goiter: Secondary | ICD-10-CM | POA: Diagnosis not present

## 2015-05-18 LAB — TSH: TSH: 2.13 u[IU]/mL (ref 0.35–4.50)

## 2015-05-18 LAB — T4, FREE: Free T4: 0.83 ng/dL (ref 0.60–1.60)

## 2015-05-20 ENCOUNTER — Other Ambulatory Visit: Payer: Self-pay

## 2015-05-20 ENCOUNTER — Ambulatory Visit (INDEPENDENT_AMBULATORY_CARE_PROVIDER_SITE_OTHER): Payer: Medicaid Other | Admitting: Endocrinology

## 2015-05-20 ENCOUNTER — Encounter: Payer: Self-pay | Admitting: Endocrinology

## 2015-05-20 VITALS — BP 108/86 | HR 80 | Temp 97.9°F | Resp 15 | Ht 62.0 in | Wt 121.4 lb

## 2015-05-20 DIAGNOSIS — E031 Congenital hypothyroidism without goiter: Secondary | ICD-10-CM | POA: Diagnosis not present

## 2015-05-20 MED ORDER — SYNTHROID 150 MCG PO TABS: 150.0000 ug | ORAL_TABLET | Freq: Every day | ORAL | Status: DC

## 2015-05-20 NOTE — Progress Notes (Signed)
Patient ID: Paula French, female   DOB: 1986-03-21, 29 y.o.   MRN: [id number]   Reason for Appointment:  Hypothyroidism, new visit    History of Present Illness:    Background history:  Her hypothyroidism  was first diagnosed  at birth She was diagnosed to have congenital hypothyroidism Previously in her youth she was followed by an endocrinologist but in the last few years had been seen by primary care physicians at the local urgent care Center When her thyroid levels are not optimal as she does have symptoms of significant fatigue, moodiness, hair loss and weight gain  She has mostly been taking 150 mcg of Synthroid when she is not pregnant She has had 3 pregnancies and usually needs a higher dose during pregnancies, generally 200 g She also thinks that she feels better when she takes brand name Synthroid compared to levothyroxine Has generally been very compliant with taking her medication in the morning before breakfast; takes her multivitamin later  She has been on 150 g since about a month after her last delivery in 9/15  She feels fairly good subjectively except for some fatigue related to her lifestyle She is not  back to her prepregnancy weight of about 120-125 Her thyroid levels are consistently normal now  Lab Results  Component Value Date   TSH 2.13 05/17/2015   TSH 1.91 11/09/2014   TSH 0.13* 09/28/2014   FREET4 0.83 05/17/2015   FREET4 1.01 11/09/2014   FREET4 1.07 09/28/2014       Past Medical History  Diagnosis Date  . Congenital hypothyroidism   . PONV (postoperative nausea and vomiting)     Past Surgical History  Procedure Laterality Date  . Abdominal hernia repair  1993, 1997  . Oophorectomy Right 09/2013    ovarian cysts    Family History  Problem Relation Age of Onset  . Thyroid disease Neg Hx     Social History:  reports that she has quit smoking. Her smoking use included Cigarettes. She has never used smokeless tobacco. She  reports that she does not drink alcohol or use illicit drugs.  Allergies:  Allergies  Allergen Reactions  . Penicillins Other (See Comments)    hallucinations  . Sulfa Antibiotics Other (See Comments)    hallucinations      Medication List       This list is accurate as of: 05/20/15 11:59 PM.  Always use your most recent med list.               flintstones complete 60 MG chewable tablet  Chew 1 tablet by mouth daily.     ibuprofen 600 MG tablet  Commonly known as:  ADVIL,MOTRIN  Take 600 mg by mouth every 6 (six) hours as needed for moderate pain.     SYNTHROID 150 MCG tablet  Generic drug:  levothyroxine  Take 1 tablet (150 mcg total) by mouth daily before breakfast.        Review of Systems:  No history of gestational diabetes   Examination:    BP 108/86 mmHg  Pulse 80  Temp(Src) 97.9 F (36.6 C) (Oral)  Resp 15  Ht 5\' 2"  (1.575 m)  Wt 121 lb 6.4 oz (55.067 kg)  BMI 22.20 kg/m2  SpO2 98%  LMP 04/25/2015  Breastfeeding? Yes   General Appearance: She looks well, no puffiness of her eyes or face   REFLEXES: at biceps are normal.     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

## 2015-05-21 ENCOUNTER — Encounter: Payer: Self-pay | Admitting: Endocrinology

## 2016-02-27 ENCOUNTER — Other Ambulatory Visit: Payer: Self-pay | Admitting: Endocrinology

## 2016-05-16 ENCOUNTER — Other Ambulatory Visit (INDEPENDENT_AMBULATORY_CARE_PROVIDER_SITE_OTHER): Payer: Medicaid Other

## 2016-05-16 DIAGNOSIS — E031 Congenital hypothyroidism without goiter: Secondary | ICD-10-CM | POA: Diagnosis not present

## 2016-05-16 LAB — TSH: TSH: 15.11 u[IU]/mL — ABNORMAL HIGH (ref 0.35–4.50)

## 2016-05-16 LAB — T4, FREE: Free T4: 0.62 ng/dL (ref 0.60–1.60)

## 2016-05-19 ENCOUNTER — Ambulatory Visit (INDEPENDENT_AMBULATORY_CARE_PROVIDER_SITE_OTHER): Payer: Medicaid Other | Admitting: Endocrinology

## 2016-05-19 ENCOUNTER — Encounter: Payer: Self-pay | Admitting: Endocrinology

## 2016-05-19 VITALS — BP 112/78 | HR 71 | Ht 62.0 in | Wt 123.0 lb

## 2016-05-19 DIAGNOSIS — E031 Congenital hypothyroidism without goiter: Secondary | ICD-10-CM

## 2016-05-19 NOTE — Progress Notes (Signed)
Patient ID: Paula French, female   DOB: [birth year], 30 y.o.   MRN: [id number]   Reason for Appointment:  Hypothyroidism, new visit    History of Present Illness:    Background history:  Her hypothyroidism  was first diagnosed  at birth She was diagnosed to have congenital hypothyroidism Previously in her youth she was followed by an endocrinologist but in the last few years had been seen by primary care physicians at the local urgent care Center When her thyroid levels are not optimal as she does have symptoms of significant fatigue, moodiness, hair loss and weight gain  She has mostly been taking 150 mcg of Synthroid when she is not pregnant She has had 3 pregnancies and usually needs a higher dose during pregnancies, generally 200 g She also thinks that she feels better when she takes brand name Synthroid compared to levothyroxine Has generally been very compliant with taking her medication in the morning before breakfast; takes her multivitamin later  She has been on 150 g since about a month after her last delivery in 9/15  More recently she has been feeling somewhat more fatigue but she thinks this is from stress at work and busy lifestyle with her family About a week prior to her lab work she lost her medication in her car and started feeling much more fatigue and listless.  She tends to have chronic cold intolerance No hair loss No significant weight change  Wt Readings from Last 3 Encounters:  05/19/16 123 lb (55.792 kg)  05/20/15 121 lb 6.4 oz (55.067 kg)  11/22/14 125 lb (56.7 kg)   Her thyroid levels Indicate significantly high TSH now    Lab Results  Component Value Date   TSH 15.11* 05/16/2016   TSH 2.13 05/17/2015   TSH 1.91 11/09/2014   FREET4 0.62 05/16/2016   FREET4 0.83 05/17/2015   FREET4 1.01 11/09/2014       Past Medical History  Diagnosis Date  . Congenital hypothyroidism   . PONV (postoperative nausea and vomiting)     Past  Surgical History  Procedure Laterality Date  . Abdominal hernia repair  1993, 1997  . Oophorectomy Right 09/2013    ovarian cysts    Family History  Problem Relation Age of Onset  . Thyroid disease Neg Hx     Social History:  reports that she has quit smoking. Her smoking use included Cigarettes. She has never used smokeless tobacco. She reports that she does not drink alcohol or use illicit drugs.  Allergies:  Allergies  Allergen Reactions  . Penicillins Other (See Comments)    hallucinations  . Sulfa Antibiotics Other (See Comments)    hallucinations      Medication List       This list is accurate as of: 05/19/16 12:02 PM.  Always use your most recent med list.               flintstones complete 60 MG chewable tablet  Chew 1 tablet by mouth daily.     SYNTHROID 150 MCG tablet  Generic drug:  levothyroxine  TAKE 1 TABLET BY MOUTH DAILY BEFORE BREAKFAST        Review of Systems:  Asking about episodes of diarrhea which have been there for some time   Examination:    BP 112/78 mmHg  Pulse 71  Ht 5\' 2"  (1.575 m)  Wt 123 lb (55.792 kg)  BMI 22.49 kg/m2  SpO2 97%  She looks well, no puffiness  of her eyes or face   REFLEXES: at biceps are normal.     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

## 2016-06-16 ENCOUNTER — Other Ambulatory Visit (INDEPENDENT_AMBULATORY_CARE_PROVIDER_SITE_OTHER): Payer: Medicaid Other

## 2016-06-16 ENCOUNTER — Other Ambulatory Visit: Payer: Self-pay | Admitting: Endocrinology

## 2016-06-16 DIAGNOSIS — E031 Congenital hypothyroidism without goiter: Secondary | ICD-10-CM

## 2016-06-16 LAB — TSH: TSH: 0.96 u[IU]/mL (ref 0.35–4.50)

## 2016-06-16 NOTE — Telephone Encounter (Signed)
Refill request for synthroid sent into patient pharmacy.

## 2016-06-16 NOTE — Telephone Encounter (Signed)
Patient need a refill of SYNTHROID 150 MCG tablet with more refills, sent to  [id number] - [place], [place] - [address] [facility] [facility] [address] [phone] (Phone) [phone] (Fax

## 2016-06-21 ENCOUNTER — Telehealth: Payer: Self-pay

## 2016-06-21 NOTE — Telephone Encounter (Signed)
Called and left voicemail for patient to return call for lab results.

## 2016-06-21 NOTE — Telephone Encounter (Signed)
-----   Message from Reather LittlerAjay Kumar, MD sent at 06/21/2016  8:37 AM EDT ----- TSH is back to normal, continue same dose

## 2016-06-22 ENCOUNTER — Encounter: Payer: Self-pay | Admitting: *Deleted

## 2016-10-05 ENCOUNTER — Other Ambulatory Visit: Payer: Self-pay | Admitting: Endocrinology

## 2017-01-27 ENCOUNTER — Other Ambulatory Visit: Payer: Self-pay | Admitting: Endocrinology

## 2017-05-10 ENCOUNTER — Other Ambulatory Visit: Payer: Self-pay | Admitting: Endocrinology

## 2017-05-10 DIAGNOSIS — E031 Congenital hypothyroidism without goiter: Secondary | ICD-10-CM

## 2017-05-15 ENCOUNTER — Other Ambulatory Visit: Payer: Medicaid Other

## 2017-05-16 ENCOUNTER — Other Ambulatory Visit: Payer: Medicaid Other

## 2017-05-16 ENCOUNTER — Other Ambulatory Visit (INDEPENDENT_AMBULATORY_CARE_PROVIDER_SITE_OTHER): Payer: Medicaid Other

## 2017-05-16 DIAGNOSIS — E031 Congenital hypothyroidism without goiter: Secondary | ICD-10-CM | POA: Diagnosis not present

## 2017-05-16 LAB — TSH: TSH: 2.94 u[IU]/mL (ref 0.35–4.50)

## 2017-05-16 LAB — T4, FREE: Free T4: 0.86 ng/dL (ref 0.60–1.60)

## 2017-05-18 ENCOUNTER — Ambulatory Visit (INDEPENDENT_AMBULATORY_CARE_PROVIDER_SITE_OTHER): Payer: Medicaid Other | Admitting: Endocrinology

## 2017-05-18 ENCOUNTER — Encounter: Payer: Self-pay | Admitting: Endocrinology

## 2017-05-18 VITALS — BP 120/84 | HR 69 | Ht 62.0 in | Wt 128.4 lb

## 2017-05-18 DIAGNOSIS — E031 Congenital hypothyroidism without goiter: Secondary | ICD-10-CM

## 2017-05-18 MED ORDER — SYNTHROID 150 MCG PO TABS: ORAL_TABLET | ORAL | 11 refills | Status: DC

## 2017-05-18 NOTE — Progress Notes (Signed)
Patient ID: Paula French, female   DOB: 1986-09-08, 31 y.o.   MRN: [id number]   Reason for Appointment:  Hypothyroidism, new visit    History of Present Illness:    Background history:  Her hypothyroidism  was first diagnosed  at birth She was diagnosed to have congenital hypothyroidism Previously in her youth she was followed by an endocrinologist but in the last few years had been seen by primary care physicians at the local urgent care Center When her thyroid levels are not optimal as she does have symptoms of significant fatigue, moodiness, hair loss and weight gain  She has mostly been taking 150 mcg of Synthroid when she is not pregnant She has had 3 pregnancies and usually needs a higher dose during pregnancies, generally 200 g She also thinks that she feels better when she takes brand name Synthroid compared to levothyroxine Has generally been very compliant with taking her medication in the morning before breakfast; takes her multivitamin later  She has been on 150 g since about a month after her last delivery in 9/15  Although her last visit she had been off her medication for a week and was having significant fatigue and feeling list less she is doing generally well now Again she has some fatigue but she thinks this is from stress at work and busy lifestyle with her family  She tends to have chronic cold intolerance No hair loss   Wt Readings from Last 3 Encounters:  05/18/17 128 lb 6.4 oz (58.2 kg)  05/19/16 123 lb (55.8 kg)  05/20/15 121 lb 6.4 oz (55.1 kg)   Her thyroid levels Indicate TSH is quite normal now    Lab Results  Component Value Date   TSH 2.94 05/16/2017   TSH 0.96 06/16/2016   TSH 15.11 (H) 05/16/2016   FREET4 0.86 05/16/2017   FREET4 0.62 05/16/2016   FREET4 0.83 05/17/2015       Past Medical History:  Diagnosis Date  . Congenital hypothyroidism   . PONV (postoperative nausea and vomiting)     Past Surgical History:    Procedure Laterality Date  . ABDOMINAL HERNIA REPAIR  1993, 1997  . OOPHORECTOMY Right 09/2013   ovarian cysts    Family History  Problem Relation Age of Onset  . Thyroid disease Neg Hx     Social History:  reports that she has quit smoking. Her smoking use included Cigarettes. She has never used smokeless tobacco. She reports that she does not drink alcohol or use drugs.  Allergies:  Allergies  Allergen Reactions  . Penicillins Other (See Comments)    hallucinations  . Sulfa Antibiotics Other (See Comments)    hallucinations    Allergies as of 05/18/2017      Reactions   Penicillins Other (See Comments)   hallucinations   Sulfa Antibiotics Other (See Comments)   hallucinations      Medication List       Accurate as of 05/18/17 11:08 AM. Always use your most recent med list.          doxepin 10 MG capsule Commonly known as:  SINEQUAN Take 10 mg by mouth daily.   DOXYCYCLINE PO Take by mouth. Taking 2 times daily for infection on cut on right foot   SYNTHROID 150 MCG tablet Generic drug:  levothyroxine TAKE 1 TABLET BY MOUTH DAILY BEFORE BREAKFAST       Review of Systems:     Examination:    BP 120/84  Pulse 69   Ht 5\' 2"  (1.575 m)   Wt 128 lb 6.4 oz (58.2 kg)   SpO2 98%   BMI 23.48 kg/m   She looks well, no puffiness of her eyes or face Deep tendon reflexes at right biceps are normal.     No peripheral edema Skin appears normal  Assessment   Congenital hypothyroidism, with atrophic thyroid  Although she is having some fatigue she is blaming this more on her work schedule and household responsibilities No other symptoms suggestive of hypothyroidism Her exam is fairly good today  She is very compliant with her medication TSH is quite normal now   Plan:  She will continue 150 mcg brand name Synthroid  Follow-up annually   [facility] 05/18/2017, 11:08 AM

## 2017-06-21 ENCOUNTER — Telehealth: Payer: Self-pay | Admitting: Endocrinology

## 2017-06-21 ENCOUNTER — Other Ambulatory Visit: Payer: Self-pay

## 2017-06-21 NOTE — Telephone Encounter (Signed)
Patient called in reference to any assistance she can get to cover cost of her Synthroid    SYNTHROID 150 MCG tablet   Please call patient and advise.

## 2017-06-21 NOTE — Telephone Encounter (Signed)
She can find out from a drug store like [facility] if Unithroid brand will be covered by insurance otherwise she will have to go to generic

## 2017-06-22 NOTE — Telephone Encounter (Signed)
Called patient and left a voice message to find out from her insurance company what will be covered other than the Synthroid and also let her know Dr. Remus BlakeKumar's suggestion for checking with the pharmacy and to please call back to let us know so we can send in a new prescription for her.

## 2017-06-22 NOTE — Telephone Encounter (Signed)
Patient called and stated that she currently does not have insurance. She will ask the pharmacist if there is a cheaper option.

## 2017-06-22 NOTE — Telephone Encounter (Signed)
Please call patient

## 2017-06-25 NOTE — Telephone Encounter (Signed)
She can also obtain a prescription from Synthroid direct program through [facility] [place] which will cost her $65 for 3 months, we can mail this information to her if needed

## 2017-06-25 NOTE — Telephone Encounter (Signed)
Called patient and she stated that the Levothyroxine does not work as well for her and I let her know about the discount card and she stated that she printed this out and [facility] and was able to get the 150 mcg Synthroid for $39. She will continue to do this until her new insurance kicks in.

## 2017-06-25 NOTE — Telephone Encounter (Signed)
She will have to go with generic levothyroxine if she has no insurance

## 2017-07-09 NOTE — Telephone Encounter (Signed)
Called patient and left vm letting the patient know that there is a program for the Synthroid- I requested a call back to discuss

## 2017-07-11 ENCOUNTER — Telehealth: Payer: Self-pay | Admitting: Endocrinology

## 2017-07-11 NOTE — Telephone Encounter (Signed)
Patient called in reference to program for Synthroid medication. Please call patient and advise. OK to leave message.

## 2017-07-12 NOTE — Telephone Encounter (Signed)
Called patient and gave her the information. If she needs to call us back and fax us something that needs a signature please do so we can help her get her medicine.

## 2017-07-12 NOTE — Telephone Encounter (Signed)
She can go to [email or web address] program and enroll, she can get 90 day supply for $65

## 2017-07-12 NOTE — Telephone Encounter (Signed)
Called patient and left a voice message for her to call us back and let us know which program she is referring to.  Do you know of a Synthroid program? Please advise.

## 2018-03-02 ENCOUNTER — Other Ambulatory Visit: Payer: Self-pay | Admitting: Endocrinology

## 2018-05-15 ENCOUNTER — Other Ambulatory Visit (INDEPENDENT_AMBULATORY_CARE_PROVIDER_SITE_OTHER): Payer: Self-pay

## 2018-05-15 ENCOUNTER — Ambulatory Visit: Payer: Self-pay | Admitting: Endocrinology

## 2018-05-15 DIAGNOSIS — E031 Congenital hypothyroidism without goiter: Secondary | ICD-10-CM

## 2018-05-15 LAB — T4, FREE: Free T4: 1.22 ng/dL (ref 0.60–1.60)

## 2018-05-15 LAB — TSH: TSH: 0.4 u[IU]/mL (ref 0.35–4.50)

## 2018-05-17 ENCOUNTER — Encounter: Payer: Self-pay | Admitting: Endocrinology

## 2018-05-17 ENCOUNTER — Ambulatory Visit: Payer: Medicaid Other | Admitting: Endocrinology

## 2018-05-17 ENCOUNTER — Ambulatory Visit: Payer: Self-pay | Admitting: Endocrinology

## 2018-05-17 VITALS — BP 108/86 | HR 86 | Ht 62.0 in | Wt 125.6 lb

## 2018-05-17 DIAGNOSIS — E031 Congenital hypothyroidism without goiter: Secondary | ICD-10-CM

## 2018-05-17 MED ORDER — SYNTHROID 150 MCG PO TABS: ORAL_TABLET | ORAL | 0 refills | Status: DC

## 2018-05-17 NOTE — Progress Notes (Signed)
Patient ID: Paula French, female   DOB: Feb 25, 1986, 32 y.o.   MRN: [id number]   Reason for Appointment:  Hypothyroidism, new visit    History of Present Illness:    Background history:  Her hypothyroidism  was first diagnosed  at birth She was diagnosed to have congenital hypothyroidism Previously in her youth she was followed by an endocrinologist but in the last few years had been seen by primary care physicians at the local urgent care Center When her thyroid levels are not optimal as she does have symptoms of significant fatigue, moodiness, hair loss and weight gain  She has mostly been taking 150 mcg of Synthroid when she is not pregnant She has had 3 pregnancies and usually needs a higher dose during pregnancies, generally 200 g  She thinks that she feels better when she takes brand name Synthroid compared to levothyroxine but has been concerned about the cost Has again been very compliant with taking her medication in the morning before breakfast; takes her multivitamin later She says that she chews the tablet and does not eat at that time  She has been on 150 g consistently now  Again she has some fatigue at times but generally feeling fairly good recently Her weight tends to fluctuate somewhat  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 Congenital hypothyroidism   . PONV (postoperative nausea and vomiting)     Past Surgical History:  Procedure Laterality Date  . ABDOMINAL HERNIA REPAIR  1993, 1997   . OOPHORECTOMY Right 09/2013   ovarian cysts    Family History  Problem Relation Age of Onset  . Thyroid disease Neg Hx     Social History:  reports that she has quit smoking. Her smoking use included cigarettes. She has never used smokeless tobacco. She reports that she does not drink alcohol or use drugs.  Allergies:  Allergies  Allergen Reactions  . Penicillins Other (See Comments)    hallucinations  . Sulfa Antibiotics Other (See Comments)    hallucinations    Allergies as of 05/17/2018      Reactions   Penicillins Other (See Comments)   hallucinations   Sulfa Antibiotics Other (See Comments)   hallucinations      Medication List        Accurate as of 05/17/18 10:35 AM. Always use your most recent med list.          SYNTHROID 150 MCG tablet Generic drug:  levothyroxine TAKE 1 TABLET BY MOUTH DAILY BEFORE BREAKFAST       Review of Systems:  She takes general multivitamin   Examination:    BP 108/86 (BP Location: Left Arm, Patient Position: Sitting, Cuff Size: Normal)   Pulse 86   Ht 5\' 2"  (1.575 m)   Wt 125 lb 9.6 oz (57 kg)   SpO2 98%   BMI 22.97 kg/m   She looks well, no  swelling of the face Neck exam normal Deep tendon reflexes at biceps are normal with normal relaxation.     No tremor  Assessment   Congenital hypothyroidism, with atrophic thyroid  Although she has been on 150 mcg dose consistently for over 3 years now and taking brand-name Synthroid she is having a low normal TSH without any significant weight loss Currently asymptomatic She looks euthyroid  She is very compliant with her medication TSH is low normal now at 0.4   Plan:  She will continue 150 mcg brand name Synthroid but will take 1/2 tablet on Sundays Given her information on Synthroid direct program since this will be more economical for her With the new dosage regimen she needs to follow-up in 6 months Also reminded her to take her medication at least 30 minutes  before eating and no multivitamins or calcium at the same time  Total visit time 15 minutes   Miaya Lafontant 05/17/2018, 10:35 AM

## 2018-05-17 NOTE — Patient Instructions (Signed)
6 1/2 tabs per week

## 2018-05-18 MED ORDER — SYNTHROID 150 MCG PO TABS
150.0000 ug | ORAL_TABLET | Freq: Every day | ORAL | 1 refills | Status: DC
Start: 2018-05-18 — End: 2019-02-11

## 2018-05-20 ENCOUNTER — Other Ambulatory Visit: Payer: Medicaid Other

## 2018-05-23 ENCOUNTER — Ambulatory Visit: Payer: Medicaid Other | Admitting: Endocrinology

## 2018-11-13 HISTORY — PX: INGUINAL HERNIA REPAIR: SUR1180

## 2018-11-25 ENCOUNTER — Other Ambulatory Visit: Payer: Medicaid Other

## 2018-11-29 ENCOUNTER — Ambulatory Visit: Payer: Medicaid Other | Admitting: Endocrinology

## 2018-11-29 DIAGNOSIS — Z0289 Encounter for other administrative examinations: Secondary | ICD-10-CM

## 2018-12-04 NOTE — L&D Delivery Note (Signed)
[facility]N [facility] Delivery Note  Paula French is a 33 y.o. [id number] s/p NSVD at [redacted]w[redacted]d. She was admitted for active labor.   ROM: 1h 88m with clear fluid GBS Status:  Negative/-- (08/26 0422) Maximum Maternal Temperature: 98.6 F    Labor Progress: . Patient arrived at 1 cm dilation and progressed spontaneously to fully dilated..   Delivery Date/Time: 08/26/2019 at 0319 Delivery: Called to room and patient was complete and pushing. Head delivered in LOA position. No nuchal cord present. Shoulder and body delivered in usual fashion. Infant with spontaneous cry, placed on mother's abdomen, dried and stimulated. Cord clamped x 2 after 1-minute delay, and cut by mother. Cord blood drawn. Placenta delivered spontaneously with gentle cord traction. Fundus firm with massage and Pitocin. Labia, perineum, vagina, and cervix inspected with no lacerations.   Placenta: 3v, intact, to L&D Complications: none Lacerations: none EBL: 137 Analgesia: none   Infant: APGAR (1 MIN): 9  APGAR (5 MINS): 9   Weight: pending  Augustin Coupe, MD/[facility] OB/GYN Fellow, Faculty Practice

## 2018-12-24 ENCOUNTER — Encounter (HOSPITAL_BASED_OUTPATIENT_CLINIC_OR_DEPARTMENT_OTHER): Payer: Self-pay

## 2018-12-24 ENCOUNTER — Ambulatory Visit (HOSPITAL_BASED_OUTPATIENT_CLINIC_OR_DEPARTMENT_OTHER): Admit: 2018-12-24 | Payer: Medicaid Other | Admitting: Obstetrics & Gynecology

## 2018-12-24 SURGERY — LAPAROSCOPY, DIAGNOSTIC
Anesthesia: Choice

## 2019-02-04 ENCOUNTER — Ambulatory Visit (INDEPENDENT_AMBULATORY_CARE_PROVIDER_SITE_OTHER): Payer: BC Managed Care – PPO | Admitting: Obstetrics & Gynecology

## 2019-02-04 ENCOUNTER — Encounter: Payer: Self-pay | Admitting: Obstetrics & Gynecology

## 2019-02-04 ENCOUNTER — Other Ambulatory Visit (HOSPITAL_COMMUNITY)
Admission: RE | Admit: 2019-02-04 | Discharge: 2019-02-04 | Disposition: A | Discharge status: Home / Self Care | Payer: BC Managed Care – PPO | Source: Ambulatory Visit | Attending: Obstetrics & Gynecology | Admitting: Obstetrics & Gynecology

## 2019-02-04 VITALS — BP 120/80 | HR 70 | Wt 137.0 lb

## 2019-02-04 DIAGNOSIS — N898 Other specified noninflammatory disorders of vagina: Secondary | ICD-10-CM | POA: Insufficient documentation

## 2019-02-04 DIAGNOSIS — O99331 Smoking (tobacco) complicating pregnancy, first trimester: Secondary | ICD-10-CM

## 2019-02-04 DIAGNOSIS — E031 Congenital hypothyroidism without goiter: Secondary | ICD-10-CM

## 2019-02-04 DIAGNOSIS — Z3480 Encounter for supervision of other normal pregnancy, unspecified trimester: Secondary | ICD-10-CM | POA: Insufficient documentation

## 2019-02-04 DIAGNOSIS — Z3A10 10 weeks gestation of pregnancy: Secondary | ICD-10-CM

## 2019-02-04 DIAGNOSIS — Z6791 Unspecified blood type, Rh negative: Secondary | ICD-10-CM

## 2019-02-04 DIAGNOSIS — O26899 Other specified pregnancy related conditions, unspecified trimester: Secondary | ICD-10-CM

## 2019-02-04 DIAGNOSIS — O99330 Smoking (tobacco) complicating pregnancy, unspecified trimester: Secondary | ICD-10-CM | POA: Insufficient documentation

## 2019-02-04 DIAGNOSIS — O360910 Maternal care for other rhesus isoimmunization, first trimester, not applicable or unspecified: Secondary | ICD-10-CM

## 2019-02-04 DIAGNOSIS — O23591 Infection of other part of genital tract in pregnancy, first trimester: Secondary | ICD-10-CM

## 2019-02-04 NOTE — Progress Notes (Signed)
  Subjective:    Paula French is a [id number] [redacted]w[redacted]d being seen today for her first obstetrical visit.  Her obstetrical history is significant for hypothyroid, Rh negative. Patient does intend to breast feed. Pregnancy history fully reviewed.  Patient reports nausea.  Vitals:   02/04/19 1529  BP: 120/80  Pulse: 70  Weight: 137 lb (62.1 kg)    HISTORY: OB History  Gravida Para Term Preterm AB Living  4 3 3     3   SAB TAB Ectopic Multiple Live Births          3    # Outcome Date GA Lbr Len/2nd Weight Sex Delivery Anes PTL Lv  4 Current           3 Term 08/25/14 [redacted]w[redacted]d 02:38 / 00:07 7 lb 11.3 oz (3.496 kg) M Vag-Spont None  LIV     Birth Comments: none  2 Term 01/11/13 [redacted]w[redacted]d  7 lb 10 oz (3.459 kg) M Vag-Spont None  LIV  1 Term 05/09/09 [redacted]w[redacted]d  6 lb 15 oz (3.147 kg) M Vag-Spont EPI  LIV   Past Medical History:  Diagnosis Date  . Congenital hypothyroidism   . PONV (postoperative nausea and vomiting)    Past Surgical History:  Procedure Laterality Date  . ABDOMINAL HERNIA REPAIR  1993, 1997  . INGUINAL HERNIA REPAIR  11/13/2018   RIGHT   . OOPHORECTOMY Right 09/2013   ovarian cysts   Family History  Problem Relation Age of Onset  . Breast cancer Mother   . Colon cancer Father   . Emphysema Maternal Grandfather   . Dementia Paternal Grandmother   . Thyroid disease Neg Hx      Exam    Uterus:     Pelvic Exam:    Perineum: No Hemorrhoids   Vulva: normal   Vagina:  normal mucosa   pH:     Cervix: no lesions   Adnexa: normal adnexa   Bony Pelvis: average  System: Breast:  normal appearance, no masses or tenderness   Skin: normal coloration and turgor, no rashes    Neurologic: oriented, normal mood   Extremities: normal strength, tone, and muscle mass   HEENT neck supple with midline trachea   Mouth/Teeth mucous membranes moist, pharynx normal without lesions and dental hygiene good   Neck supple   Cardiovascular: regular rate and rhythm   Respiratory:  appears  well, vitals normal, no respiratory distress, acyanotic, normal RR   Abdomen: soft, non-tender; bowel sounds normal; no masses,  no organomegaly   Urinary: urethral meatus normal      Assessment:    Pregnancy: [id number] Patient Active Problem List   Diagnosis Date Noted  . Supervision of other normal pregnancy, antepartum 02/04/2019  . Tobacco smoking affecting pregnancy 02/04/2019  . NSVD (normal spontaneous vaginal delivery) 08/26/2014  . Congenital hypothyroidism 07/21/2014  . Rh negative state in antepartum period 07/21/2014  . Ovary absent 07/21/2014        Plan:     Initial labs drawn. Prenatal vitamins. Problem list reviewed and updated. Genetic Screening discussed {GENETIC SCREENING Panorama  Ultrasound discussed; fetal survey: ordered.  Follow up in 4 weeks. 50% of 30 min visit spent on counseling and coordination of care.  Found documentation that she received Rhogam after last delivery   Scheryl Darter 02/04/2019

## 2019-02-04 NOTE — Progress Notes (Signed)
Pt presents for NOB visit c/o vaginal spotting after wiping.

## 2019-02-04 NOTE — Patient Instructions (Signed)
First Trimester of Pregnancy  The first trimester of pregnancy is from week 1 until the end of week 13 (months 1 through 3). A week after a sperm fertilizes an egg, the egg will implant on the wall of the uterus. This embryo will begin to develop into a baby. Genes from you and your partner will form the baby. The female genes will determine whether the baby will be a boy or a girl. At 6-8 weeks, the eyes and face will be formed, and the heartbeat can be seen on ultrasound. At the end of 12 weeks, all the baby's organs will be formed.  Now that you are pregnant, you will want to do everything you can to have a healthy baby. Two of the most important things are to get good prenatal care and to follow your health care provider's instructions. Prenatal care is all the medical care you receive before the baby's birth. This care will help prevent, find, and treat any problems during the pregnancy and childbirth.  Body changes during your first trimester  Your body goes through many changes during pregnancy. The changes vary from woman to woman.   You may gain or lose a couple of pounds at first.   You may feel sick to your stomach (nauseous) and you may throw up (vomit). If the vomiting is uncontrollable, call your health care provider.   You may tire easily.   You may develop headaches that can be relieved by medicines. All medicines should be approved by your health care provider.   You may urinate more often. Painful urination may mean you have a bladder infection.   You may develop heartburn as a result of your pregnancy.   You may develop constipation because certain hormones are causing the muscles that push stool through your intestines to slow down.   You may develop hemorrhoids or swollen veins (varicose veins).   Your breasts may begin to grow larger and become tender. Your nipples may stick out more, and the tissue that surrounds them (areola) may become darker.   Your gums may bleed and may be  sensitive to brushing and flossing.   Dark spots or blotches (chloasma, mask of pregnancy) may develop on your face. This will likely fade after the baby is born.   Your menstrual periods will stop.   You may have a loss of appetite.   You may develop cravings for certain kinds of food.   You may have changes in your emotions from day to day, such as being excited to be pregnant or being concerned that something may go wrong with the pregnancy and baby.   You may have more vivid and strange dreams.   You may have changes in your hair. These can include thickening of your hair, rapid growth, and changes in texture. Some women also have hair loss during or after pregnancy, or hair that feels dry or thin. Your hair will most likely return to normal after your baby is born.  What to expect at prenatal visits  During a routine prenatal visit:   You will be weighed to make sure you and the baby are growing normally.   Your blood pressure will be taken.   Your abdomen will be measured to track your baby's growth.   The fetal heartbeat will be listened to between weeks 10 and 14 of your pregnancy.   Test results from any previous visits will be discussed.  Your health care provider may ask you:     How you are feeling.   If you are feeling the baby move.   If you have had any abnormal symptoms, such as leaking fluid, bleeding, severe headaches, or abdominal cramping.   If you are using any tobacco products, including cigarettes, chewing tobacco, and electronic cigarettes.   If you have any questions.  Other tests that may be performed during your first trimester include:   Blood tests to find your blood type and to check for the presence of any previous infections. The tests will also be used to check for low iron levels (anemia) and protein on red blood cells (Rh antibodies). Depending on your risk factors, or if you previously had diabetes during pregnancy, you may have tests to check for high blood sugar  that affects pregnant women (gestational diabetes).   Urine tests to check for infections, diabetes, or protein in the urine.   An ultrasound to confirm the proper growth and development of the baby.   Fetal screens for spinal cord problems (spina bifida) and Down syndrome.   HIV (human immunodeficiency virus) testing. Routine prenatal testing includes screening for HIV, unless you choose not to have this test.   You may need other tests to make sure you and the baby are doing well.  Follow these instructions at home:  Medicines   Follow your health care provider's instructions regarding medicine use. Specific medicines may be either safe or unsafe to take during pregnancy.   Take a prenatal vitamin that contains at least 600 micrograms (mcg) of folic acid.   If you develop constipation, try taking a stool softener if your health care provider approves.  Eating and drinking     Eat a balanced diet that includes fresh fruits and vegetables, whole grains, good sources of protein such as meat, eggs, or tofu, and low-fat dairy. Your health care provider will help you determine the amount of weight gain that is right for you.   Avoid raw meat and uncooked cheese. These carry germs that can cause birth defects in the baby.   Eating four or five small meals rather than three large meals a day may help relieve nausea and vomiting. If you start to feel nauseous, eating a few soda crackers can be helpful. Drinking liquids between meals, instead of during meals, also seems to help ease nausea and vomiting.   Limit foods that are high in fat and processed sugars, such as fried and sweet foods.   To prevent constipation:  ? Eat foods that are high in fiber, such as fresh fruits and vegetables, whole grains, and beans.  ? Drink enough fluid to keep your urine clear or pale yellow.  Activity   Exercise only as directed by your health care provider. Most women can continue their usual exercise routine during  pregnancy. Try to exercise for 30 minutes at least 5 days a week. Exercising will help you:  ? Control your weight.  ? Stay in shape.  ? Be prepared for labor and delivery.   Experiencing pain or cramping in the lower abdomen or lower back is a good sign that you should stop exercising. Check with your health care provider before continuing with normal exercises.   Try to avoid standing for long periods of time. Move your legs often if you must stand in one place for a long time.   Avoid heavy lifting.   Wear low-heeled shoes and practice good posture.   You may continue to have sex unless your health care   provider tells you not to.  Relieving pain and discomfort   Wear a good support bra to relieve breast tenderness.   Take warm sitz baths to soothe any pain or discomfort caused by hemorrhoids. Use hemorrhoid cream if your health care provider approves.   Rest with your legs elevated if you have leg cramps or low back pain.   If you develop varicose veins in your legs, wear support hose. Elevate your feet for 15 minutes, 3-4 times a day. Limit salt in your diet.  Prenatal care   Schedule your prenatal visits by the twelfth week of pregnancy. They are usually scheduled monthly at first, then more often in the last 2 months before delivery.   Write down your questions. Take them to your prenatal visits.   Keep all your prenatal visits as told by your health care provider. This is important.  Safety   Wear your seat belt at all times when driving.   Make a list of emergency phone numbers, including numbers for family, friends, the hospital, and police and fire departments.  General instructions   Ask your health care provider for a referral to a local prenatal education class. Begin classes no later than the beginning of month 6 of your pregnancy.   Ask for help if you have counseling or nutritional needs during pregnancy. Your health care provider can offer advice or refer you to specialists for help  with various needs.   Do not use hot tubs, steam rooms, or saunas.   Do not douche or use tampons or scented sanitary pads.   Do not cross your legs for long periods of time.   Avoid cat litter boxes and soil used by cats. These carry germs that can cause birth defects in the baby and possibly loss of the fetus by miscarriage or stillbirth.   Avoid all smoking, herbs, alcohol, and medicines not prescribed by your health care provider. Chemicals in these products affect the formation and growth of the baby.   Do not use any products that contain nicotine or tobacco, such as cigarettes and e-cigarettes. If you need help quitting, ask your health care provider. You may receive counseling support and other resources to help you quit.   Schedule a dentist appointment. At home, brush your teeth with a soft toothbrush and be gentle when you floss.  Contact a health care provider if:   You have dizziness.   You have mild pelvic cramps, pelvic pressure, or nagging pain in the abdominal area.   You have persistent nausea, vomiting, or diarrhea.   You have a bad smelling vaginal discharge.   You have pain when you urinate.   You notice increased swelling in your face, hands, legs, or ankles.   You are exposed to fifth disease or chickenpox.   You are exposed to German measles (rubella) and have never had it.  Get help right away if:   You have a fever.   You are leaking fluid from your vagina.   You have spotting or bleeding from your vagina.   You have severe abdominal cramping or pain.   You have rapid weight gain or loss.   You vomit blood or material that looks like coffee grounds.   You develop a severe headache.   You have shortness of breath.   You have any kind of trauma, such as from a fall or a car accident.  Summary   The first trimester of pregnancy is from week 1 until   the end of week 13 (months 1 through 3).   Your body goes through many changes during pregnancy. The changes vary from  woman to woman.   You will have routine prenatal visits. During those visits, your health care provider will examine you, discuss any test results you may have, and talk with you about how you are feeling.  This information is not intended to replace advice given to you by your health care provider. Make sure you discuss any questions you have with your health care provider.  Document Released: 11/14/2001 Document Revised: 11/01/2016 Document Reviewed: 11/01/2016  Elsevier Interactive Patient Education  2019 [employer]

## 2019-02-06 LAB — CERVICOVAGINAL ANCILLARY ONLY
Bacterial vaginitis: NEGATIVE
Candida vaginitis: NEGATIVE
Chlamydia: NEGATIVE
NEISSERIA GONORRHEA: NEGATIVE
TRICH (WINDOWPATH): NEGATIVE

## 2019-02-07 LAB — URINE CULTURE, OB REFLEX

## 2019-02-07 LAB — CULTURE, OB URINE

## 2019-02-11 ENCOUNTER — Ambulatory Visit (HOSPITAL_COMMUNITY)
Admission: RE | Admit: 2019-02-11 | Discharge: 2019-02-11 | Disposition: A | Discharge status: Home / Self Care | Payer: BC Managed Care – PPO | Source: Ambulatory Visit | Attending: Obstetrics & Gynecology | Admitting: Obstetrics & Gynecology

## 2019-02-11 ENCOUNTER — Ambulatory Visit (HOSPITAL_COMMUNITY): Payer: BC Managed Care – PPO | Admitting: *Deleted

## 2019-02-11 ENCOUNTER — Encounter (HOSPITAL_COMMUNITY): Payer: Self-pay

## 2019-02-11 ENCOUNTER — Other Ambulatory Visit: Payer: Self-pay | Admitting: Obstetrics & Gynecology

## 2019-02-11 VITALS — BP 115/76 | HR 88 | Wt 137.2 lb

## 2019-02-11 DIAGNOSIS — Z3A11 11 weeks gestation of pregnancy: Secondary | ICD-10-CM

## 2019-02-11 DIAGNOSIS — E039 Hypothyroidism, unspecified: Secondary | ICD-10-CM

## 2019-02-11 DIAGNOSIS — O99280 Endocrine, nutritional and metabolic diseases complicating pregnancy, unspecified trimester: Secondary | ICD-10-CM | POA: Diagnosis present

## 2019-02-11 DIAGNOSIS — Z3480 Encounter for supervision of other normal pregnancy, unspecified trimester: Secondary | ICD-10-CM | POA: Diagnosis not present

## 2019-02-11 DIAGNOSIS — O4691 Antepartum hemorrhage, unspecified, first trimester: Secondary | ICD-10-CM

## 2019-02-11 DIAGNOSIS — O99281 Endocrine, nutritional and metabolic diseases complicating pregnancy, first trimester: Secondary | ICD-10-CM | POA: Diagnosis not present

## 2019-02-11 LAB — OBSTETRIC PANEL, INCLUDING HIV
Antibody Screen: NEGATIVE
Basophils Absolute: 0 10*3/uL (ref 0.0–0.2)
Basos: 0 %
EOS (ABSOLUTE): 0.1 10*3/uL (ref 0.0–0.4)
Eos: 1 %
HEMATOCRIT: 40.8 % (ref 34.0–46.6)
HEMOGLOBIN: 13.7 g/dL (ref 11.1–15.9)
HEP B S AG: NEGATIVE
HIV Screen 4th Generation wRfx: NONREACTIVE
Immature Grans (Abs): 0 10*3/uL (ref 0.0–0.1)
Immature Granulocytes: 0 %
LYMPHS: 23 %
Lymphocytes Absolute: 1.9 10*3/uL (ref 0.7–3.1)
MCH: 30 pg (ref 26.6–33.0)
MCHC: 33.6 g/dL (ref 31.5–35.7)
MCV: 90 fL (ref 79–97)
MONOCYTES: 6 %
Monocytes Absolute: 0.6 10*3/uL (ref 0.1–0.9)
Neutrophils Absolute: 6 10*3/uL (ref 1.4–7.0)
Neutrophils: 70 %
Platelets: 217 10*3/uL (ref 150–450)
RBC: 4.56 x10E6/uL (ref 3.77–5.28)
RDW: 12.4 % (ref 11.7–15.4)
RPR: NONREACTIVE
RUBELLA: 7.03 {index} (ref >0.99)
Rh Factor: NEGATIVE
WBC: 8.6 10*3/uL (ref 3.4–10.8)

## 2019-02-11 LAB — SMN1 COPY NUMBER ANALYSIS (SMA CARRIER SCREENING)

## 2019-02-11 LAB — HEMOGLOBINOPATHY EVALUATION
HEMOGLOBIN A2 QUANTITATION: 2.2 % (ref 1.8–3.2)
HGB C: 0 %
HGB S: 0 %
HGB VARIANT: 0 %
Hemoglobin F Quantitation: 0 % (ref 0.0–2.0)
Hgb A: 97.8 % (ref 96.4–98.8)

## 2019-02-11 LAB — CYSTIC FIBROSIS MUTATION 97: Interpretation: NOT DETECTED

## 2019-02-11 LAB — TSH: TSH: 31.02 u[IU]/mL — ABNORMAL HIGH (ref 0.450–4.500)

## 2019-02-11 MED ORDER — LEVOTHYROXINE SODIUM 200 MCG PO TABS: 200.0000 ug | ORAL_TABLET | Freq: Every day | ORAL | 11 refills | Status: DC

## 2019-02-11 NOTE — Progress Notes (Unsigned)
Synthroid 200 mcg daily Rx

## 2019-02-12 ENCOUNTER — Other Ambulatory Visit: Payer: Self-pay | Admitting: Endocrinology

## 2019-02-12 ENCOUNTER — Other Ambulatory Visit (HOSPITAL_COMMUNITY): Payer: Self-pay | Admitting: *Deleted

## 2019-02-12 ENCOUNTER — Other Ambulatory Visit: Payer: Self-pay | Admitting: Obstetrics & Gynecology

## 2019-02-12 DIAGNOSIS — O99282 Endocrine, nutritional and metabolic diseases complicating pregnancy, second trimester: Secondary | ICD-10-CM

## 2019-02-12 DIAGNOSIS — E039 Hypothyroidism, unspecified: Secondary | ICD-10-CM

## 2019-02-12 NOTE — Progress Notes (Signed)
Synthroid called to pharmacy

## 2019-02-13 ENCOUNTER — Other Ambulatory Visit: Payer: Self-pay

## 2019-02-13 ENCOUNTER — Encounter: Payer: Self-pay | Admitting: Obstetrics & Gynecology

## 2019-02-13 MED ORDER — SYNTHROID 200 MCG PO TABS: 200.0000 ug | ORAL_TABLET | Freq: Every day | ORAL | 11 refills | Status: DC

## 2019-02-13 NOTE — Progress Notes (Signed)
Brand name synthroid sent instead of generic.

## 2019-03-04 ENCOUNTER — Telehealth: Payer: Self-pay

## 2019-03-04 ENCOUNTER — Ambulatory Visit (INDEPENDENT_AMBULATORY_CARE_PROVIDER_SITE_OTHER): Payer: BC Managed Care – PPO | Admitting: Obstetrics and Gynecology

## 2019-03-04 ENCOUNTER — Other Ambulatory Visit: Payer: Self-pay

## 2019-03-04 ENCOUNTER — Encounter: Payer: Self-pay | Admitting: Obstetrics and Gynecology

## 2019-03-04 DIAGNOSIS — Z3A14 14 weeks gestation of pregnancy: Secondary | ICD-10-CM

## 2019-03-04 DIAGNOSIS — Z6791 Unspecified blood type, Rh negative: Secondary | ICD-10-CM

## 2019-03-04 DIAGNOSIS — O26899 Other specified pregnancy related conditions, unspecified trimester: Secondary | ICD-10-CM

## 2019-03-04 DIAGNOSIS — O360920 Maternal care for other rhesus isoimmunization, second trimester, not applicable or unspecified: Secondary | ICD-10-CM

## 2019-03-04 DIAGNOSIS — O26892 Other specified pregnancy related conditions, second trimester: Secondary | ICD-10-CM

## 2019-03-04 DIAGNOSIS — E031 Congenital hypothyroidism without goiter: Secondary | ICD-10-CM

## 2019-03-04 DIAGNOSIS — Z3480 Encounter for supervision of other normal pregnancy, unspecified trimester: Secondary | ICD-10-CM

## 2019-03-04 NOTE — Telephone Encounter (Signed)
  Left VM Mess to call Office for Televisit.

## 2019-03-04 NOTE — Progress Notes (Signed)
Televisit ROB.  She has the [employer] and says she can use her phone to do Blood Pressure.

## 2019-03-04 NOTE — Progress Notes (Signed)
   TELEHEALTH VIRTUAL OBSTETRICS VISIT ENCOUNTER NOTE  I connected with Paula French on 03/04/19 at  3:15 PM EDT by telephone at home and verified that I am speaking with the correct person using two identifiers.   I discussed the limitations, risks, security and privacy concerns of performing an evaluation and management service by telephone and the availability of in person appointments. I also discussed with the patient that there may be a patient responsible charge related to this service. The patient expressed understanding and agreed to proceed.  Subjective:  Paula French is a 33 y.o. G4P3003 at [redacted]w[redacted]d being followed for ongoing prenatal care.  She is currently monitored for the following issues for this high-risk pregnancy and has Congenital hypothyroidism; Rh negative state in antepartum period; Ovary absent; Supervision of other normal pregnancy, antepartum; and Tobacco smoking affecting pregnancy on their problem list.  Patient reports no complaints. Reports fetal movement. Denies any contractions, bleeding or leaking of fluid.   The following portions of the patient's history were reviewed and updated as appropriate: allergies, current medications, past family history, past medical history, past social history, past surgical history and problem list.   Objective:   General:  Alert, oriented and cooperative.   Mental Status: Normal French and affect perceived. Normal judgment and thought content.  Rest of physical exam deferred due to type of encounter  Assessment and Plan:  Pregnancy: G4P3003 at [redacted]w[redacted]d 1. Supervision of other normal pregnancy, antepartum Patient is doing well without complaints Patient does not have access to a BP cuff and will receive one at her next appointment as she is coming in for labs  2. Rh negative state in antepartum period Will receive rhogam at 28 weeks  3. Congenital hypothyroidism Currently on synthroid Will check TSH at her next visit  Preterm  labor symptoms and general obstetric precautions including but not limited to vaginal bleeding, contractions, leaking of fluid and fetal movement were reviewed in detail with the patient.  I discussed the assessment and treatment plan with the patient. The patient was provided an opportunity to ask questions and all were answered. The patient agreed with the plan and demonstrated an understanding of the instructions. The patient was advised to call back or seek an in-person office evaluation/go to MAU at [facility] for any urgent or concerning symptoms. Please refer to After Visit Summary for other counseling recommendations.   I provided 15 minutes of non-face-to-face time during this encounter.  Return in about 4 weeks (around 04/01/2019) for ROB, in person.  Future Appointments  Date Time Provider Department Center  04/01/2019  2:50 PM [facility] NURSE [facility] M[facility]-US  04/01/2019  3:00 PM [facility] [place] 3 [facility]-M[facility]US MFC-US    Catalina Antigua, MD Center for [employer], [facility]

## 2019-03-19 ENCOUNTER — Telehealth: Payer: Self-pay

## 2019-03-19 NOTE — Telephone Encounter (Signed)
Returned pt's call. LVM for pt to c/b

## 2019-04-01 ENCOUNTER — Ambulatory Visit (HOSPITAL_COMMUNITY): Payer: BC Managed Care – PPO | Admitting: *Deleted

## 2019-04-01 ENCOUNTER — Other Ambulatory Visit: Payer: Self-pay

## 2019-04-01 ENCOUNTER — Ambulatory Visit (HOSPITAL_COMMUNITY)
Admission: RE | Admit: 2019-04-01 | Discharge: 2019-04-01 | Disposition: A | Discharge status: Home / Self Care | Payer: BC Managed Care – PPO | Source: Ambulatory Visit | Attending: Obstetrics and Gynecology | Admitting: Obstetrics and Gynecology

## 2019-04-01 ENCOUNTER — Ambulatory Visit (INDEPENDENT_AMBULATORY_CARE_PROVIDER_SITE_OTHER): Payer: BC Managed Care – PPO | Admitting: Obstetrics & Gynecology

## 2019-04-01 ENCOUNTER — Encounter (HOSPITAL_COMMUNITY): Payer: Self-pay

## 2019-04-01 ENCOUNTER — Other Ambulatory Visit: Payer: Medicaid Other

## 2019-04-01 VITALS — BP 121/71 | HR 79 | Temp 98.6°F | Wt 144.4 lb

## 2019-04-01 DIAGNOSIS — Z3480 Encounter for supervision of other normal pregnancy, unspecified trimester: Secondary | ICD-10-CM

## 2019-04-01 DIAGNOSIS — Z3A18 18 weeks gestation of pregnancy: Secondary | ICD-10-CM

## 2019-04-01 DIAGNOSIS — O0990 Supervision of high risk pregnancy, unspecified, unspecified trimester: Secondary | ICD-10-CM | POA: Insufficient documentation

## 2019-04-01 DIAGNOSIS — E031 Congenital hypothyroidism without goiter: Secondary | ICD-10-CM

## 2019-04-01 DIAGNOSIS — O26892 Other specified pregnancy related conditions, second trimester: Secondary | ICD-10-CM

## 2019-04-01 DIAGNOSIS — Z3482 Encounter for supervision of other normal pregnancy, second trimester: Secondary | ICD-10-CM

## 2019-04-01 DIAGNOSIS — O99282 Endocrine, nutritional and metabolic diseases complicating pregnancy, second trimester: Secondary | ICD-10-CM

## 2019-04-01 DIAGNOSIS — E039 Hypothyroidism, unspecified: Secondary | ICD-10-CM | POA: Diagnosis present

## 2019-04-01 DIAGNOSIS — Z363 Encounter for antenatal screening for malformations: Secondary | ICD-10-CM | POA: Diagnosis not present

## 2019-04-01 MED ORDER — COMFORT FIT MATERNITY SUPP LG MISC: 1.0000 [IU] | Freq: Every day | 0 refills | Status: DC

## 2019-04-01 NOTE — Patient Instructions (Signed)

## 2019-04-01 NOTE — Progress Notes (Signed)
   TELEHEALTH VIRTUAL OBSTETRICS VISIT ENCOUNTER NOTE  I connected with Paula French on 04/01/19 at  1:30 PM EDT by telephone at home and verified that I am speaking with the correct person using two identifiers.   I discussed the limitations, risks, security and privacy concerns of performing an evaluation and management service by telephone and the availability of in person appointments. I also discussed with the patient that there may be a patient responsible charge related to this service. The patient expressed understanding and agreed to proceed.  Subjective:  Paula French is a 33 y.o. G4P3003 at [redacted]w[redacted]d being followed for ongoing prenatal care.  She is currently monitored for the following issues for this high-risk pregnancy and has Congenital hypothyroidism; Rh negative state in antepartum period; Ovary absent; Supervision of other normal pregnancy, antepartum; and Tobacco smoking affecting pregnancy on their problem list.  Patient reports groin pain. Reports fetal movement. Denies any contractions, bleeding or leaking of fluid.   The following portions of the patient's history were reviewed and updated as appropriate: allergies, current medications, past family history, past medical history, past social history, past surgical history and problem list.   Objective:   General:  Alert, oriented and cooperative.   Mental Status: Normal French and affect perceived. Normal judgment and thought content.  Rest of physical exam deferred due to type of encounter  Assessment and Plan:  Pregnancy: G4P3003 at [redacted]w[redacted]d 1. Supervision of other normal pregnancy, antepartum For pain - Elastic Bandages & Supports (COMFORT FIT MATERNITY SUPP LG) MISC; 1 Units by Does not apply route daily.  Dispense: 1 each; Refill: 0  2. Congenital hypothyroidism TSH today at [facility]  Preterm labor symptoms and general obstetric precautions including but not limited to vaginal bleeding, contractions, leaking of fluid and  fetal movement were reviewed in detail with the patient.  I discussed the assessment and treatment plan with the patient. The patient was provided an opportunity to ask questions and all were answered. The patient agreed with the plan and demonstrated an understanding of the instructions. The patient was advised to call back or seek an in-person office evaluation/go to MAU at [facility] for any urgent or concerning symptoms. Please refer to After Visit Summary for other counseling recommendations.   I provided 10 minutes of non-face-to-face time during this encounter.  Return for needs 28 week labs in 9 weeks, babyscripts.  Future Appointments  Date Time Provider Department Center  04/01/2019  2:50 PM [facility] NURSE WH-M[facility] MFC-US  04/01/2019  3:00 PM [facility]-M[facility] [place] 3 WH-MFCUS MFC-US    Scheryl Darter, MD Center for [facility], [facility]

## 2019-04-01 NOTE — Progress Notes (Signed)
Pt is on the phone to prepare for Webex visit with provider. [redacted]w[redacted]d. Pt has anatomy scan [place] scheduled for 3pm today.

## 2019-04-02 ENCOUNTER — Other Ambulatory Visit (HOSPITAL_COMMUNITY): Payer: Self-pay | Admitting: *Deleted

## 2019-04-02 DIAGNOSIS — Z362 Encounter for other antenatal screening follow-up: Secondary | ICD-10-CM

## 2019-04-04 LAB — AFP, SERUM, OPEN SPINA BIFIDA
AFP MoM: 0.85
AFP Value: 41.7 ng/mL
Gest. Age on Collection Date: 18.9 weeks
Maternal Age At EDD: 32.3 yr
OSBR Risk 1 IN: 10000
Test Results:: NEGATIVE
Weight: 144 [lb_av]

## 2019-04-14 ENCOUNTER — Other Ambulatory Visit: Payer: Self-pay

## 2019-04-14 ENCOUNTER — Other Ambulatory Visit: Payer: BC Managed Care – PPO

## 2019-04-14 DIAGNOSIS — E031 Congenital hypothyroidism without goiter: Secondary | ICD-10-CM

## 2019-04-19 LAB — T4+FREE T4
Free T4 by Dialysis: 1.1 ng/dL
Thyroxine (T4): 13 ug/dL

## 2019-04-19 LAB — TSH: TSH: 2.53 u[IU]/mL (ref 0.450–4.500)

## 2019-04-30 ENCOUNTER — Encounter (HOSPITAL_COMMUNITY): Payer: Self-pay

## 2019-04-30 ENCOUNTER — Ambulatory Visit (HOSPITAL_COMMUNITY)
Admission: RE | Admit: 2019-04-30 | Discharge: 2019-04-30 | Disposition: A | Discharge status: Home / Self Care | Payer: BC Managed Care – PPO | Source: Ambulatory Visit | Attending: Maternal & Fetal Medicine | Admitting: Maternal & Fetal Medicine

## 2019-04-30 ENCOUNTER — Other Ambulatory Visit: Payer: Self-pay

## 2019-04-30 ENCOUNTER — Other Ambulatory Visit (HOSPITAL_COMMUNITY): Payer: Self-pay | Admitting: *Deleted

## 2019-04-30 ENCOUNTER — Ambulatory Visit (HOSPITAL_COMMUNITY): Payer: BC Managed Care – PPO | Admitting: *Deleted

## 2019-04-30 VITALS — Temp 97.6°F

## 2019-04-30 DIAGNOSIS — O99280 Endocrine, nutritional and metabolic diseases complicating pregnancy, unspecified trimester: Secondary | ICD-10-CM

## 2019-04-30 DIAGNOSIS — E039 Hypothyroidism, unspecified: Secondary | ICD-10-CM | POA: Insufficient documentation

## 2019-04-30 DIAGNOSIS — Z362 Encounter for other antenatal screening follow-up: Secondary | ICD-10-CM | POA: Diagnosis not present

## 2019-04-30 DIAGNOSIS — Z3A23 23 weeks gestation of pregnancy: Secondary | ICD-10-CM | POA: Diagnosis not present

## 2019-06-04 ENCOUNTER — Encounter: Payer: Self-pay | Admitting: Certified Nurse Midwife

## 2019-06-04 ENCOUNTER — Other Ambulatory Visit: Payer: Self-pay

## 2019-06-04 ENCOUNTER — Other Ambulatory Visit: Payer: BC Managed Care – PPO

## 2019-06-04 ENCOUNTER — Ambulatory Visit (INDEPENDENT_AMBULATORY_CARE_PROVIDER_SITE_OTHER): Payer: BC Managed Care – PPO | Admitting: Certified Nurse Midwife

## 2019-06-04 VITALS — BP 114/76 | HR 96 | Temp 98.7°F | Wt 157.2 lb

## 2019-06-04 DIAGNOSIS — O26899 Other specified pregnancy related conditions, unspecified trimester: Secondary | ICD-10-CM

## 2019-06-04 DIAGNOSIS — Z3A28 28 weeks gestation of pregnancy: Secondary | ICD-10-CM

## 2019-06-04 DIAGNOSIS — O360130 Maternal care for anti-D [Rh] antibodies, third trimester, not applicable or unspecified: Secondary | ICD-10-CM | POA: Diagnosis not present

## 2019-06-04 DIAGNOSIS — O99333 Smoking (tobacco) complicating pregnancy, third trimester: Secondary | ICD-10-CM

## 2019-06-04 DIAGNOSIS — Z9889 Other specified postprocedural states: Secondary | ICD-10-CM

## 2019-06-04 DIAGNOSIS — Z8719 Personal history of other diseases of the digestive system: Secondary | ICD-10-CM

## 2019-06-04 DIAGNOSIS — Z3480 Encounter for supervision of other normal pregnancy, unspecified trimester: Secondary | ICD-10-CM

## 2019-06-04 DIAGNOSIS — Z6791 Unspecified blood type, Rh negative: Secondary | ICD-10-CM

## 2019-06-04 DIAGNOSIS — O99330 Smoking (tobacco) complicating pregnancy, unspecified trimester: Secondary | ICD-10-CM

## 2019-06-04 DIAGNOSIS — E031 Congenital hypothyroidism without goiter: Secondary | ICD-10-CM

## 2019-06-04 MED ORDER — RHO D IMMUNE GLOBULIN 1500 UNIT/2ML IJ SOSY
300.0000 ug | PREFILLED_SYRINGE | Freq: Once | INTRAMUSCULAR | Status: AC
  Administered 2019-06-04: 300 ug via INTRAMUSCULAR

## 2019-06-04 NOTE — Progress Notes (Signed)
   PRENATAL VISIT NOTE  Subjective:  Paula French is a 33 y.o. G4P3003 at [redacted]w[redacted]d being seen today for ongoing prenatal care.  She is currently monitored for the following issues for this low-risk pregnancy and has Congenital hypothyroidism; Rh negative state in antepartum period; Ovary absent; Supervision of other normal pregnancy, antepartum; Tobacco smoking affecting pregnancy; and H/O hernia repair on their problem list.  Patient reports no complaints.  Contractions: Not present. Vag. Bleeding: None.  Movement: Present. Denies leaking of fluid.   The following portions of the patient's history were reviewed and updated as appropriate: allergies, current medications, past family history, past medical history, past social history, past surgical history and problem list.   Objective:   Vitals:   06/04/19 0847 06/04/19 0852  BP: 130/78 114/76  Pulse: 96   Temp: 98.7 F (37.1 C)   Weight: 157 lb 3.2 oz (71.3 kg)     Fetal Status: Fetal Heart Rate (bpm): 135 Fundal Height: 27 cm Movement: Present     General:  Alert, oriented and cooperative. Patient is in no acute distress.  Skin: Skin is warm and dry. No rash noted.   Cardiovascular: Normal heart rate noted  Respiratory: Normal respiratory effort, no problems with respiration noted  Abdomen: Soft, gravid, appropriate for gestational age.  Pain/Pressure: Absent     Pelvic: Cervical exam deferred        Extremities: Normal range of motion.  Edema: None  Mental Status: Normal mood and affect. Normal behavior. Normal judgment and thought content.   Assessment and Plan:  Pregnancy: G4P3003 at [redacted]w[redacted]d 1. Supervision of other normal pregnancy, antepartum - Patient doing well, no complaints - Patient ate breakfast this morning, unable to complete 2hr GTT- rescheduled  - Anticipatory guidance on upcoming appointments including GTT, patient reports being unable to tolerate GTT drink- discussed options of jelly beans for testing, patient  agrees to jelly beans  - GTT scheduled for tomorrow, will draw TSH labs that day as well, future orders placed   2. Rh negative state in antepartum period - Rhogam done today   3. H/O hernia repair - Hx of surgery in December 2019, nervous about reinjury during delivery  - Educated and discussed labor positions and use of water during labor to keep body relaxed to prevent tearing   4. Tobacco smoking affecting pregnancy, antepartum - F/u growth [place] scheduled   5. Congenital hypothyroidism - TSH to be drawn tomorrow  - currently on 228mcg of synthroid   Preterm labor symptoms and general obstetric precautions including but not limited to vaginal bleeding, contractions, leaking of fluid and fetal movement were reviewed in detail with the patient. Please refer to After Visit Summary for other counseling recommendations.   Return in about 4 weeks (around 07/02/2019) for [place]- mychart visit .  Future Appointments  Date Time Provider Home  06/05/2019  8:15 AM [facility] [place] None  06/25/2019  1:30 PM [place] [facility]-US  06/25/2019  1:30 PM [place] 1 WH-MFCUS MFC-US  07/02/2019  2:00 PM Lajean Manes, CNM [place] None    Lajean Manes, [place]

## 2019-06-04 NOTE — Progress Notes (Signed)
Pt not fasting; 2 gtt rescheduled Rhogam injection given LUOQ w/o difficulty

## 2019-06-04 NOTE — Patient Instructions (Signed)
Glucose Tolerance Test During Pregnancy Why am I having this test? The glucose tolerance test (GTT) is done to check how your body processes sugar (glucose). This is one of several tests used to diagnose diabetes that develops during pregnancy (gestational diabetes mellitus). Gestational diabetes is a temporary form of diabetes that some women develop during pregnancy. It usually occurs during the second trimester of pregnancy and goes away after delivery. Testing (screening) for gestational diabetes usually occurs between 24 and 28 weeks of pregnancy. You may have the GTT test after having a 1-hour glucose screening test if the results from that test indicate that you may have gestational diabetes. You may also have this test if:  You have a history of gestational diabetes.  You have a history of giving birth to very large babies or have experienced repeated fetal loss (stillbirth).  You have signs and symptoms of diabetes, such as: ? Changes in your vision. ? Tingling or numbness in your hands or feet. ? Changes in hunger, thirst, and urination that are not otherwise explained by your pregnancy. What is being tested? This test measures the amount of glucose in your blood at different times during a period of 3 hours. This indicates how well your body is able to process glucose. What kind of sample is taken?  Blood samples are required for this test. They are usually collected by inserting a needle into a blood vessel. How do I prepare for this test?  For 3 days before your test, eat normally. Have plenty of carbohydrate-rich foods.  Follow instructions from your health care provider about: ? Eating or drinking restrictions on the day of the test. You may be asked to not eat or drink anything other than water (fast) starting 8-10 hours before the test. ? Changing or stopping your regular medicines. Some medicines may interfere with this test. Tell a health care provider about:  All  medicines you are taking, including vitamins, herbs, eye drops, creams, and over-the-counter medicines.  Any blood disorders you have.  Any surgeries you have had.  Any medical conditions you have. What happens during the test? First, your blood glucose will be measured. This is referred to as your fasting blood glucose, since you fasted before the test. Then, you will drink a glucose solution that contains a certain amount of glucose. Your blood glucose will be measured again 1, 2, and 3 hours after drinking the solution. This test takes about 3 hours to complete. You will need to stay at the testing location during this time. During the testing period:  Do not eat or drink anything other than the glucose solution.  Do not exercise.  Do not use any products that contain nicotine or tobacco, such as cigarettes and e-cigarettes. If you need help stopping, ask your health care provider. The testing procedure may vary among health care providers and hospitals. How are the results reported? Your results will be reported as milligrams of glucose per deciliter of blood (mg/dL) or millimoles per liter (mmol/L). Your health care provider will compare your results to normal ranges that were established after testing a large group of people (reference ranges). Reference ranges may vary among labs and hospitals. For this test, common reference ranges are:  Fasting: less than 95-105 mg/dL (5.3-5.8 mmol/L).  1 hour after drinking glucose: less than 180-190 mg/dL (10.0-10.5 mmol/L).  2 hours after drinking glucose: less than 155-165 mg/dL (8.6-9.2 mmol/L).  3 hours after drinking glucose: 140-145 mg/dL (7.8-8.1 mmol/L). What do the   results mean? Results within reference ranges are considered normal, meaning that your glucose levels are well-controlled. If two or more of your blood glucose levels are high, you may be diagnosed with gestational diabetes. If only one level is high, your health care  provider may suggest repeat testing or other tests to confirm a diagnosis. Talk with your health care provider about what your results mean. Questions to ask your health care provider Ask your health care provider, or the department that is doing the test:  When will my results be ready?  How will I get my results?  What are my treatment options?  What other tests do I need?  What are my next steps? Summary  The glucose tolerance test (GTT) is one of several tests used to diagnose diabetes that develops during pregnancy (gestational diabetes mellitus). Gestational diabetes is a temporary form of diabetes that some women develop during pregnancy.  You may have the GTT test after having a 1-hour glucose screening test if the results from that test indicate that you may have gestational diabetes. You may also have this test if you have any symptoms or risk factors for gestational diabetes.  Talk with your health care provider about what your results mean. This information is not intended to replace advice given to you by your health care provider. Make sure you discuss any questions you have with your health care provider. Document Released: 05/21/2012 Document Revised: 03/13/2019 Document Reviewed: 07/02/2017 Elsevier Patient Education  2020 Elsevier Inc.  

## 2019-06-05 ENCOUNTER — Other Ambulatory Visit: Payer: BC Managed Care – PPO

## 2019-06-05 DIAGNOSIS — Z3480 Encounter for supervision of other normal pregnancy, unspecified trimester: Secondary | ICD-10-CM

## 2019-06-05 MED ORDER — COMFORT FIT MATERNITY SUPP SM MISC: 1.0000 [IU] | Freq: Every day | 0 refills | Status: DC | PRN

## 2019-06-05 NOTE — Addendum Note (Signed)
Addended by: Mliss Fritz on: 06/05/2019 09:37 AM   Modules accepted: Orders

## 2019-06-05 NOTE — Addendum Note (Signed)
Addended by: Lajean Manes on: 06/05/2019 08:22 AM   Modules accepted: Orders

## 2019-06-06 LAB — CBC
Hematocrit: 37.4 % (ref 34.0–46.6)
Hemoglobin: 12.4 g/dL (ref 11.1–15.9)
MCH: 30.4 pg (ref 26.6–33.0)
MCHC: 33.2 g/dL (ref 31.5–35.7)
MCV: 92 fL (ref 79–97)
Platelets: 176 10*3/uL (ref 150–450)
RBC: 4.08 x10E6/uL (ref 3.77–5.28)
RDW: 12.3 % (ref 11.7–15.4)
WBC: 9.7 10*3/uL (ref 3.4–10.8)

## 2019-06-06 LAB — GLUCOSE TOLERANCE, 2 HOURS W/ 1HR
Glucose, 1 hour: 85 mg/dL (ref 65–179)
Glucose, 2 hour: 80 mg/dL (ref 65–152)
Glucose, Fasting: 72 mg/dL (ref 65–91)

## 2019-06-06 LAB — RPR: RPR Ser Ql: NONREACTIVE

## 2019-06-06 LAB — HIV ANTIBODY (ROUTINE TESTING W REFLEX): HIV Screen 4th Generation wRfx: NONREACTIVE

## 2019-06-06 LAB — TSH: TSH: 9 u[IU]/mL — ABNORMAL HIGH (ref 0.450–4.500)

## 2019-06-09 ENCOUNTER — Encounter: Payer: Self-pay | Admitting: Certified Nurse Midwife

## 2019-06-09 MED ORDER — LEVOTHYROXINE SODIUM 75 MCG PO TABS: 225.0000 ug | ORAL_TABLET | Freq: Every day | ORAL | 6 refills | Status: DC

## 2019-06-09 NOTE — Addendum Note (Signed)
Addended by: Lajean Manes on: 06/09/2019 05:59 PM   Modules accepted: Orders

## 2019-06-10 ENCOUNTER — Other Ambulatory Visit: Payer: Self-pay | Admitting: Certified Nurse Midwife

## 2019-06-10 DIAGNOSIS — E031 Congenital hypothyroidism without goiter: Secondary | ICD-10-CM

## 2019-06-10 MED ORDER — LEVOTHYROXINE SODIUM 75 MCG PO TABS: 225.0000 ug | ORAL_TABLET | Freq: Every day | ORAL | 3 refills | Status: DC

## 2019-06-25 ENCOUNTER — Ambulatory Visit (HOSPITAL_COMMUNITY)
Admission: RE | Admit: 2019-06-25 | Discharge: 2019-06-25 | Disposition: A | Discharge status: Home / Self Care | Payer: BC Managed Care – PPO | Source: Ambulatory Visit | Attending: Obstetrics and Gynecology | Admitting: Obstetrics and Gynecology

## 2019-06-25 ENCOUNTER — Ambulatory Visit (HOSPITAL_COMMUNITY): Payer: BC Managed Care – PPO | Admitting: *Deleted

## 2019-06-25 ENCOUNTER — Encounter (HOSPITAL_COMMUNITY): Payer: Self-pay

## 2019-06-25 ENCOUNTER — Other Ambulatory Visit (HOSPITAL_COMMUNITY): Payer: Self-pay | Admitting: *Deleted

## 2019-06-25 ENCOUNTER — Other Ambulatory Visit: Payer: Self-pay

## 2019-06-25 DIAGNOSIS — Z9889 Other specified postprocedural states: Secondary | ICD-10-CM | POA: Insufficient documentation

## 2019-06-25 DIAGNOSIS — Z3A31 31 weeks gestation of pregnancy: Secondary | ICD-10-CM

## 2019-06-25 DIAGNOSIS — E039 Hypothyroidism, unspecified: Secondary | ICD-10-CM

## 2019-06-25 DIAGNOSIS — O99283 Endocrine, nutritional and metabolic diseases complicating pregnancy, third trimester: Secondary | ICD-10-CM | POA: Diagnosis not present

## 2019-06-25 DIAGNOSIS — Z8719 Personal history of other diseases of the digestive system: Secondary | ICD-10-CM | POA: Diagnosis present

## 2019-06-25 DIAGNOSIS — O99280 Endocrine, nutritional and metabolic diseases complicating pregnancy, unspecified trimester: Secondary | ICD-10-CM | POA: Diagnosis not present

## 2019-06-25 DIAGNOSIS — Z362 Encounter for other antenatal screening follow-up: Secondary | ICD-10-CM | POA: Diagnosis not present

## 2019-07-02 ENCOUNTER — Encounter: Payer: Self-pay | Admitting: *Deleted

## 2019-07-02 ENCOUNTER — Telehealth (INDEPENDENT_AMBULATORY_CARE_PROVIDER_SITE_OTHER): Payer: BC Managed Care – PPO | Admitting: Certified Nurse Midwife

## 2019-07-02 ENCOUNTER — Encounter: Payer: Self-pay | Admitting: Certified Nurse Midwife

## 2019-07-02 VITALS — BP 107/75 | HR 115

## 2019-07-02 DIAGNOSIS — Z3480 Encounter for supervision of other normal pregnancy, unspecified trimester: Secondary | ICD-10-CM

## 2019-07-02 DIAGNOSIS — Z3A32 32 weeks gestation of pregnancy: Secondary | ICD-10-CM

## 2019-07-02 DIAGNOSIS — E031 Congenital hypothyroidism without goiter: Secondary | ICD-10-CM

## 2019-07-02 DIAGNOSIS — Z8719 Personal history of other diseases of the digestive system: Secondary | ICD-10-CM

## 2019-07-02 DIAGNOSIS — Z6791 Unspecified blood type, Rh negative: Secondary | ICD-10-CM

## 2019-07-02 DIAGNOSIS — O26899 Other specified pregnancy related conditions, unspecified trimester: Secondary | ICD-10-CM

## 2019-07-02 DIAGNOSIS — Z9889 Other specified postprocedural states: Secondary | ICD-10-CM

## 2019-07-02 DIAGNOSIS — O360930 Maternal care for other rhesus isoimmunization, third trimester, not applicable or unspecified: Secondary | ICD-10-CM

## 2019-07-02 NOTE — Progress Notes (Signed)
TELEHEALTH OBSTETRICS PRENATAL VIRTUAL VIDEO VISIT ENCOUNTER NOTE  [facility]   I connected with Paula French on 07/02/19 at  2:00 PM EDT by MyChart Video Encounter at home and verified that I am speaking with the correct person using two identifiers.   I discussed the limitations, risks, security and privacy concerns of performing an evaluation and management service virtually and the availability of in person appointments. I also discussed with the patient that there may be a patient responsible charge related to this service. The patient expressed understanding and agreed to proceed. Subjective:  Paula French is a 33 y.o. [phone] at 7125w0d being seen today for ongoing prenatal care.  She is currently monitored for the following issues for this high-risk pregnancy and has Congenital hypothyroidism; Rh negative state in antepartum period; Ovary absent; Supervision of other normal pregnancy, antepartum; Tobacco smoking affecting pregnancy; and H/O hernia repair on their problem list.  Patient reports no complaints.  Contractions: Irritability. Vag. Bleeding: None.  Movement: Present. Denies any leaking of fluid.   The following portions of the patient's history were reviewed and updated as appropriate: allergies, current medications, past family history, past medical history, past social history, past surgical history and problem list.   Objective:   Vitals:   07/02/19 1359  BP: 107/75  Pulse: (!) 115    Fetal Status:     Movement: Present     General:  Alert, oriented and cooperative. Patient is in no acute distress.  Respiratory: Normal respiratory effort, no problems with respiration noted  Mental Status: Normal mood and affect. Normal behavior. Normal judgment and thought content.  Rest of physical exam deferred due to type of encounter  Imaging: [place] Mfm Ob Follow Up  Result Date: 06/25/2019  ----------------------------------------------------------------------  OBSTETRICS REPORT                       (Signed Final 06/25/2019 02:27 pm) ---------------------------------------------------------------------- Patient Info  ID #:       [id number]                          D.O.B.:  [birth year] (33 yrs)  Name:       Paula French                   Visit Date: 06/25/2019 01:41 pm ---------------------------------------------------------------------- Performed By  Performed By:     Rennie PlowmanErica Lyskawa          Ref. Address:     [address]                    [place]                                                             [address]  Attending:        Lin Landsmanorenthian Booker      [facility]  Referred By:      [facility] ---------------------------------------------------------------------- Orders   #  Description                          Code         Ordered By   1  [place] MFM OB FOLLOW UP                  16109.60     RAVI SHANKAR  ----------------------------------------------------------------------   #  Order #                    Accession #                 Episode #   1  [id number]                  [id number]                  [id number]  ---------------------------------------------------------------------- Indications   [redacted] weeks gestation of pregnancy                Z3A.31   Encounter for other antenatal screening        Z36.2   follow-up (low risk NIPS)   Hypothyroid                                    O99.280 E03.9  ---------------------------------------------------------------------- Vital Signs  Weight (lb): 157                               Height:        5'2"  BMI:         28.71 ---------------------------------------------------------------------- Fetal Evaluation  Num Of Fetuses:         1  Fetal Heart Rate(bpm):  145  Cardiac Activity:       Observed  Presentation:           Breech  Placenta:               Anterior  P. Cord  Insertion:      Visualized, central  Amniotic Fluid  AFI FV:      Within normal limits  AFI Sum(cm)     %Tile       Largest Pocket(cm)  18.54           70          5.68  RUQ(cm)       RLQ(cm)       LUQ(cm)        LLQ(cm)  3.9           4.83          5.68           4.13 ---------------------------------------------------------------------- Biometry  BPD:      81.5  mm     G. Age:  32w 5d         87  %    CI:        74.03   %    70 - 86  FL/HC:      18.4   %    19.3 - 21.3  HC:      300.8  mm     G. Age:  33w 3d         79  %    HC/AC:      1.11        0.96 - 1.17  AC:      270.1  mm     G. Age:  31w 1d         50  %    FL/BPD:     67.7   %    71 - 87  FL:       55.2  mm     G. Age:  29w 1d          4  %    FL/AC:      20.4   %    20 - 24  HUM:      48.8  mm     G. Age:  28w 5d        < 5  %  LV:        4.5  mm  ULN:      49.3  mm     G. Age:  31w 2d         42  %  TIB:        48  mm     G. Age:  29w 0d         14  %  RAD:      41.3  mm     G. Age:  28w 5d         38  %  FIB:      47.3  mm     G. Age:  29w 1d         46  %  Est. FW:    1647  gm    3 lb 10 oz      32  % ---------------------------------------------------------------------- OB History  Gravidity:    4         Term:   3        Prem:   0        SAB:   0  TOP:          0       Ectopic:  0        Living: 3 ---------------------------------------------------------------------- Gestational Age  LMP:           31w 0d        Date:  11/20/18                 EDD:   08/27/19  U/S Today:     31w 4d                                        EDD:   08/23/19  Best:          31w 0d     Det. By:  LMP  (11/20/18)          EDD:   08/27/19 ---------------------------------------------------------------------- Anatomy  Cranium:               Appears normal         LVOT:  Previously seen  Cavum:                 Appears normal         Aortic Arch:            Previously seen  Ventricles:            Appears  normal         Ductal Arch:            Previously seen  Choroid Plexus:        Appears normal         Diaphragm:              Appears normal  Cerebellum:            Appears normal         Stomach:                Appears normal, left                                                                        sided  Posterior Fossa:       Appears normal         Abdomen:                Appears normal  Nuchal Fold:           Not applicable (>20    Abdominal Wall:         Appears nml (cord                         wks GA)                                        insert, abd wall)  Face:                  Orbits and profile     Cord Vessels:           Appears normal (3                         previously seen                                vessel cord)  Lips:                  Previously seen        Kidneys:                Appear normal  Palate:                Previously seen        Bladder:                Appears normal  Thoracic:              Appears normal         Spine:  Previously seen  Heart:                 Appears normal         Upper Extremities:      Previously seen                         (4CH, axis, and                         situs)  RVOT:                  Previously seen        Lower Extremities:      Previously seen  Other:  Heels and 5th digit previously seen. Fetus appears to be a female. ---------------------------------------------------------------------- Cervix Uterus Adnexa  Cervix  Not visualized (advanced GA >24wks)  Left Ovary  Within normal limits.  Right Ovary  Not visualized.  Adnexa  No abnormality visualized. ---------------------------------------------------------------------- Impression  Normal interval growth.  Hypothyroid ---------------------------------------------------------------------- Recommendations  Follow up growth in 4 weeks. ----------------------------------------------------------------------               Sander Nephew, MD Electronically Signed Final Report    06/25/2019 02:27 pm ----------------------------------------------------------------------   Assessment and Plan:  Pregnancy: [id number] at [redacted]w[redacted]d 1. 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  - Will give work note at her next prenatal visit  - COVID19 precautions    2. Congenital hypothyroidism - Currently on 234mcg, patient reports started taking on 7/9  - Recheck TSH in 4-6 weeks after starting dose, within the next 2 weeks  - Follow up US and BPP scheduled   3. Rh negative state in antepartum period - Rhogam PP, last received on 7/1 at 28 week appointment   4. H/O hernia repair  Preterm labor symptoms and general obstetric precautions including but not limited to vaginal bleeding, contractions, leaking of fluid and fetal movement were reviewed in detail with the patient. I discussed the assessment and treatment plan with the patient. The patient was provided an opportunity to ask questions and all were answered. The patient agreed with the plan and demonstrated an understanding of the instructions. The patient was advised to call back or seek an in-person office evaluation/go to [facility]U at [facility] for any urgent or concerning symptoms. Please refer to After Visit Summary for other counseling recommendations.   I provided 12 minutes of face-to-face time during this encounter.  Return in about 2 weeks (around 07/16/2019) for ROB/TSH in person .  Future Appointments  Date Time Provider [place]  07/04/2019  3:40 PM [place] [facility]  07/04/2019  3:45 PM [place] 2 WH-MFCUS M[facility]-US  07/11/2019  1:45 PM [place] [facility]  07/11/2019  1:45 PM [place]  [place] 2 WH-MFCUS MFC-US  07/16/2019  1:45 PM Lajean Manes, CNM [facility] None  07/18/2019  2:45 PM [place] NURSE [place] [facility]-US  07/18/2019  2:45 PM [place] 2 WH-MFCUS MFC-US  07/25/2019  1:40 PM [place] NURSE [place] [facility]-US  07/25/2019  1:45 PM [place], [place] [facility] [place] [facility]

## 2019-07-02 NOTE — Progress Notes (Signed)
Pt has f/u u/s on Friday 7/31.

## 2019-07-04 ENCOUNTER — Ambulatory Visit (HOSPITAL_COMMUNITY): Payer: BC Managed Care – PPO

## 2019-07-04 ENCOUNTER — Other Ambulatory Visit: Payer: Self-pay

## 2019-07-04 ENCOUNTER — Ambulatory Visit (HOSPITAL_COMMUNITY)
Admission: RE | Admit: 2019-07-04 | Discharge: 2019-07-04 | Disposition: A | Discharge status: Home / Self Care | Payer: BC Managed Care – PPO | Source: Ambulatory Visit | Attending: Maternal & Fetal Medicine | Admitting: Maternal & Fetal Medicine

## 2019-07-04 ENCOUNTER — Encounter (HOSPITAL_COMMUNITY): Payer: Self-pay

## 2019-07-04 ENCOUNTER — Ambulatory Visit (HOSPITAL_COMMUNITY): Payer: BC Managed Care – PPO | Admitting: *Deleted

## 2019-07-04 DIAGNOSIS — Z3A32 32 weeks gestation of pregnancy: Secondary | ICD-10-CM

## 2019-07-04 DIAGNOSIS — O99283 Endocrine, nutritional and metabolic diseases complicating pregnancy, third trimester: Secondary | ICD-10-CM

## 2019-07-04 DIAGNOSIS — Z9889 Other specified postprocedural states: Secondary | ICD-10-CM

## 2019-07-04 DIAGNOSIS — O99280 Endocrine, nutritional and metabolic diseases complicating pregnancy, unspecified trimester: Secondary | ICD-10-CM | POA: Diagnosis not present

## 2019-07-04 DIAGNOSIS — Z8719 Personal history of other diseases of the digestive system: Secondary | ICD-10-CM | POA: Diagnosis present

## 2019-07-04 DIAGNOSIS — E039 Hypothyroidism, unspecified: Secondary | ICD-10-CM | POA: Diagnosis not present

## 2019-07-11 ENCOUNTER — Encounter (HOSPITAL_COMMUNITY): Payer: Self-pay

## 2019-07-11 ENCOUNTER — Other Ambulatory Visit: Payer: Self-pay

## 2019-07-11 ENCOUNTER — Ambulatory Visit (HOSPITAL_COMMUNITY): Payer: BC Managed Care – PPO | Admitting: *Deleted

## 2019-07-11 ENCOUNTER — Ambulatory Visit (HOSPITAL_COMMUNITY)
Admission: RE | Admit: 2019-07-11 | Discharge: 2019-07-11 | Disposition: A | Discharge status: Home / Self Care | Payer: BC Managed Care – PPO | Source: Ambulatory Visit | Attending: Obstetrics and Gynecology | Admitting: Obstetrics and Gynecology

## 2019-07-11 DIAGNOSIS — Z3A33 33 weeks gestation of pregnancy: Secondary | ICD-10-CM

## 2019-07-11 DIAGNOSIS — O99283 Endocrine, nutritional and metabolic diseases complicating pregnancy, third trimester: Secondary | ICD-10-CM | POA: Diagnosis not present

## 2019-07-11 DIAGNOSIS — E039 Hypothyroidism, unspecified: Secondary | ICD-10-CM | POA: Diagnosis not present

## 2019-07-11 DIAGNOSIS — Z8719 Personal history of other diseases of the digestive system: Secondary | ICD-10-CM | POA: Diagnosis present

## 2019-07-11 DIAGNOSIS — O99280 Endocrine, nutritional and metabolic diseases complicating pregnancy, unspecified trimester: Secondary | ICD-10-CM | POA: Insufficient documentation

## 2019-07-11 DIAGNOSIS — Z9889 Other specified postprocedural states: Secondary | ICD-10-CM | POA: Insufficient documentation

## 2019-07-16 ENCOUNTER — Other Ambulatory Visit: Payer: Self-pay

## 2019-07-16 ENCOUNTER — Ambulatory Visit (INDEPENDENT_AMBULATORY_CARE_PROVIDER_SITE_OTHER): Payer: BC Managed Care – PPO | Admitting: Certified Nurse Midwife

## 2019-07-16 ENCOUNTER — Encounter: Payer: Self-pay | Admitting: Certified Nurse Midwife

## 2019-07-16 VITALS — BP 132/88 | HR 106 | Temp 98.7°F | Wt 163.2 lb

## 2019-07-16 DIAGNOSIS — Z3A34 34 weeks gestation of pregnancy: Secondary | ICD-10-CM

## 2019-07-16 DIAGNOSIS — Z3480 Encounter for supervision of other normal pregnancy, unspecified trimester: Secondary | ICD-10-CM

## 2019-07-16 DIAGNOSIS — W19XXXA Unspecified fall, initial encounter: Secondary | ICD-10-CM

## 2019-07-16 DIAGNOSIS — O9A213 Injury, poisoning and certain other consequences of external causes complicating pregnancy, third trimester: Secondary | ICD-10-CM

## 2019-07-16 DIAGNOSIS — E031 Congenital hypothyroidism without goiter: Secondary | ICD-10-CM

## 2019-07-16 NOTE — Progress Notes (Signed)
Pt fell yesterday injuring her R shoulder, R knee and L thumb.

## 2019-07-16 NOTE — Progress Notes (Signed)
   PRENATAL VISIT NOTE  Subjective:  Paula French is a 33 y.o. [phone] at [redacted]w[redacted]d being seen today for ongoing prenatal care.  She is currently monitored for the following issues for this high-risk pregnancy and has Congenital hypothyroidism; Rh negative state in antepartum period; Ovary absent; Supervision of other normal pregnancy, antepartum; Tobacco smoking affecting pregnancy; and H/O hernia repair on their problem list.  Patient reports thumb and wrist pain.  Contractions: Irritability. Vag. Bleeding: None.  Movement: Present. Denies leaking of fluid.   The following portions of the patient's history were reviewed and updated as appropriate: allergies, current medications, past family history, past medical history, past social history, past surgical history and problem list.   Objective:   Vitals:   07/16/19 1355  BP: 132/88  Pulse: (!) 106  Temp: 98.7 F (37.1 C)  Weight: 163 lb 3.2 oz (74 kg)    Fetal Status: Fetal Heart Rate (bpm): 147   Movement: Present     General:  Alert, oriented and cooperative. Patient is in no acute distress.  Skin: Skin is warm and dry. No rash noted.   Cardiovascular: Normal heart rate noted  Respiratory: Normal respiratory effort, no problems with respiration noted  Abdomen: Soft, gravid, appropriate for gestational age.  Pain/Pressure: Present     Pelvic: Cervical exam deferred        Extremities: Normal range of motion.  Edema: Trace  Mental Status: Normal mood and affect. Normal behavior. Normal judgment and thought content.   Assessment and Plan:  Pregnancy: [id number] at [redacted]w[redacted]d 1. 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 Congenital hypothyroidism - Currently on 225mg  of synthroid, repeat TSH done today  - TSH  3. Fall, initial encounter - Patient reports playing with dog and kicking a ball, tripped over ball and fell. Hit shoulder and hand- denies abdominal trauma  - Did not go to MAU to be evaluated, fall happened yesterday morning- over 24 hours ago  - - Educated on importance of being seen in MAU after any type of fall, patient verbalizes understanding   Preterm labor symptoms and general obstetric precautions including but not limited to vaginal bleeding, contractions, leaking of fluid and fetal movement were reviewed in detail with the patient. Please refer to After Visit Summary for other counseling recommendations.   Return in about 2 weeks (around 07/30/2019) for ROB/GBS.  Future Appointments  Date Time Provider [place]  07/18/2019  2:45 PM [place] [facility]  07/18/2019  2:45 PM [place] 2 WH-MFCUS MFC-US  07/25/2019  1:40 PM [place] [facility]-US  07/25/2019  1:45 PM [place] 2 WH-MFCUS MFC-US  07/30/2019  4:00 PM Lajean Manes, [place] None    Lajean Manes, CNM

## 2019-07-16 NOTE — Patient Instructions (Signed)

## 2019-07-17 LAB — TSH: TSH: 0.135 u[IU]/mL — ABNORMAL LOW (ref 0.450–4.500)

## 2019-07-18 ENCOUNTER — Encounter (HOSPITAL_COMMUNITY): Payer: Self-pay

## 2019-07-18 ENCOUNTER — Ambulatory Visit (HOSPITAL_COMMUNITY): Payer: BC Managed Care – PPO | Admitting: *Deleted

## 2019-07-18 ENCOUNTER — Other Ambulatory Visit: Payer: Self-pay

## 2019-07-18 ENCOUNTER — Ambulatory Visit (HOSPITAL_BASED_OUTPATIENT_CLINIC_OR_DEPARTMENT_OTHER)
Admission: RE | Admit: 2019-07-18 | Discharge: 2019-07-18 | Disposition: A | Discharge status: Home / Self Care | Payer: BC Managed Care – PPO | Source: Ambulatory Visit | Attending: Obstetrics and Gynecology | Admitting: Obstetrics and Gynecology

## 2019-07-18 DIAGNOSIS — Z3A34 34 weeks gestation of pregnancy: Secondary | ICD-10-CM

## 2019-07-18 DIAGNOSIS — O99280 Endocrine, nutritional and metabolic diseases complicating pregnancy, unspecified trimester: Secondary | ICD-10-CM

## 2019-07-18 DIAGNOSIS — Z9889 Other specified postprocedural states: Secondary | ICD-10-CM | POA: Insufficient documentation

## 2019-07-18 DIAGNOSIS — E039 Hypothyroidism, unspecified: Secondary | ICD-10-CM | POA: Insufficient documentation

## 2019-07-18 DIAGNOSIS — Z8719 Personal history of other diseases of the digestive system: Secondary | ICD-10-CM

## 2019-07-19 ENCOUNTER — Inpatient Hospital Stay (HOSPITAL_COMMUNITY)
Admission: AD | Admit: 2019-07-19 | Discharge: 2019-07-21 | DRG: 833 | Disposition: A | Discharge status: Home / Self Care | Payer: BC Managed Care – PPO | Attending: Obstetrics and Gynecology | Admitting: Obstetrics and Gynecology

## 2019-07-19 ENCOUNTER — Encounter (HOSPITAL_COMMUNITY): Payer: Self-pay | Admitting: *Deleted

## 2019-07-19 ENCOUNTER — Other Ambulatory Visit: Payer: Self-pay

## 2019-07-19 DIAGNOSIS — Z87891 Personal history of nicotine dependence: Secondary | ICD-10-CM | POA: Diagnosis not present

## 2019-07-19 DIAGNOSIS — Z20828 Contact with and (suspected) exposure to other viral communicable diseases: Secondary | ICD-10-CM | POA: Diagnosis present

## 2019-07-19 DIAGNOSIS — O4703 False labor before 37 completed weeks of gestation, third trimester: Secondary | ICD-10-CM | POA: Diagnosis not present

## 2019-07-19 DIAGNOSIS — O26893 Other specified pregnancy related conditions, third trimester: Secondary | ICD-10-CM | POA: Diagnosis present

## 2019-07-19 DIAGNOSIS — Z6791 Unspecified blood type, Rh negative: Secondary | ICD-10-CM

## 2019-07-19 DIAGNOSIS — Z3A34 34 weeks gestation of pregnancy: Secondary | ICD-10-CM | POA: Diagnosis not present

## 2019-07-19 DIAGNOSIS — O99283 Endocrine, nutritional and metabolic diseases complicating pregnancy, third trimester: Secondary | ICD-10-CM | POA: Diagnosis present

## 2019-07-19 DIAGNOSIS — O9989 Other specified diseases and conditions complicating pregnancy, childbirth and the puerperium: Secondary | ICD-10-CM | POA: Diagnosis not present

## 2019-07-19 DIAGNOSIS — E039 Hypothyroidism, unspecified: Secondary | ICD-10-CM | POA: Diagnosis not present

## 2019-07-19 DIAGNOSIS — O4693 Antepartum hemorrhage, unspecified, third trimester: Secondary | ICD-10-CM | POA: Diagnosis not present

## 2019-07-19 DIAGNOSIS — E031 Congenital hypothyroidism without goiter: Secondary | ICD-10-CM | POA: Diagnosis present

## 2019-07-19 DIAGNOSIS — O4690 Antepartum hemorrhage, unspecified, unspecified trimester: Secondary | ICD-10-CM

## 2019-07-19 LAB — URINALYSIS, ROUTINE W REFLEX MICROSCOPIC
Bilirubin Urine: NEGATIVE
Glucose, UA: NEGATIVE mg/dL
Hgb urine dipstick: NEGATIVE
Ketones, ur: NEGATIVE mg/dL
Nitrite: NEGATIVE
Protein, ur: NEGATIVE mg/dL
Specific Gravity, Urine: 1.006 (ref 1.005–1.030)
WBC, UA: >50 WBC/hpf — ABNORMAL HIGH (ref 0–5)
pH: 7 (ref 5.0–8.0)

## 2019-07-19 LAB — CBC
HCT: 35.7 % — ABNORMAL LOW (ref 36.0–46.0)
Hemoglobin: 12.4 g/dL (ref 12.0–15.0)
MCH: 30.7 pg (ref 26.0–34.0)
MCHC: 34.7 g/dL (ref 30.0–36.0)
MCV: 88.4 fL (ref 80.0–100.0)
Platelets: 167 10*3/uL (ref 150–400)
RBC: 4.04 MIL/uL (ref 3.87–5.11)
RDW: 12.7 % (ref 11.5–15.5)
WBC: 9.9 10*3/uL (ref 4.0–10.5)
nRBC: 0 % (ref 0.0–0.2)

## 2019-07-19 MED ORDER — LACTATED RINGERS IV SOLN
INTRAVENOUS | Status: DC
  Administered 2019-07-19 – 2019-07-20 (×3): via INTRAVENOUS

## 2019-07-19 MED ORDER — NIFEDIPINE ER OSMOTIC RELEASE 30 MG PO TB24
30.0000 mg | ORAL_TABLET | Freq: Two times a day (BID) | ORAL | Status: DC
  Administered 2019-07-20 – 2019-07-21 (×4): 30 mg via ORAL
  Filled 2019-07-19 (×4): qty 1

## 2019-07-19 MED ORDER — BETAMETHASONE SOD PHOS & ACET 6 (3-3) MG/ML IJ SUSP
12.0000 mg | INTRAMUSCULAR | Status: AC
  Administered 2019-07-20 (×2): 12 mg via INTRAMUSCULAR
  Filled 2019-07-19 (×2): qty 2

## 2019-07-19 MED ORDER — DOCUSATE SODIUM 100 MG PO CAPS
100.0000 mg | ORAL_CAPSULE | Freq: Every day | ORAL | Status: DC
  Administered 2019-07-20 – 2019-07-21 (×2): 100 mg via ORAL
  Filled 2019-07-19 (×2): qty 1

## 2019-07-19 MED ORDER — LACTATED RINGERS IV BOLUS
1000.0000 mL | Freq: Once | INTRAVENOUS | Status: AC
  Administered 2019-07-19: 1000 mL via INTRAVENOUS

## 2019-07-19 MED ORDER — FAMOTIDINE 20 MG PO TABS
20.0000 mg | ORAL_TABLET | Freq: Every day | ORAL | Status: DC
  Administered 2019-07-20 – 2019-07-21 (×3): 20 mg via ORAL
  Filled 2019-07-19 (×3): qty 1

## 2019-07-19 MED ORDER — PRENATAL MULTIVITAMIN CH
1.0000 | ORAL_TABLET | Freq: Every day | ORAL | Status: DC
  Administered 2019-07-20: 1 via ORAL
  Filled 2019-07-19: qty 1

## 2019-07-19 MED ORDER — LEVOTHYROXINE SODIUM 75 MCG PO TABS
225.0000 ug | ORAL_TABLET | Freq: Every day | ORAL | Status: DC
  Administered 2019-07-20 – 2019-07-21 (×2): 225 ug via ORAL
  Filled 2019-07-19: qty 3
  Filled 2019-07-19: qty 1
  Filled 2019-07-19: qty 3

## 2019-07-19 MED ORDER — ACETAMINOPHEN 325 MG PO TABS
650.0000 mg | ORAL_TABLET | ORAL | Status: DC | PRN
  Administered 2019-07-20 (×2): 650 mg via ORAL
  Filled 2019-07-19 (×3): qty 2

## 2019-07-19 MED ORDER — CALCIUM CARBONATE ANTACID 500 MG PO CHEW: 2.0000 | CHEWABLE_TABLET | ORAL | Status: DC | PRN

## 2019-07-19 NOTE — MAU Provider Note (Signed)
History     CSN: [id number]  Arrival date and time: 07/19/19 2012   First Provider Initiated Contact with Patient 07/19/19 2109      Chief Complaint  Patient presents with  . Contractions   [name] is a 33 y.o. [phone] at [redacted]w[redacted]d who presents to MAU for ctx. CTX palpable by provider at bedside, not well-accounted for on monitor.  Onset: 1600 today Location: abdomen Duration: ~5hrs Character: stomach tightening "like a rock," low back cramping (which appears first), pt reports the ctx she had upon provider entering room was the first time she felt a sensation of "pelvic cramping" like she is used to with her previous pregnancies Aggravating/Associated: none/none Relieving: none Treatment: water, left sided-lying, ate dinner (1930), walking Severity: 5/10 at worst  Pt denies VB, LOF, decreased FM, vaginal discharge/odor/itching. Pt denies N/V, abdominal pain, constipation, diarrhea, or urinary problems. Pt denies fever, chills, fatigue, sweating or changes in appetite. Pt denies SOB or chest pain. Pt denies dizziness, HA, light-headedness, weakness.  Problems this pregnancy include: Rh NEGATIVE, hypothyroidism. Allergies? Dicyclomine, PCN, Sulfa Current medications/supplements? Synthroid , PNVs Prenatal care provider? FEMINA, next appt 07/30/2019  OB History    Gravida  4   Para  3   Term  3   Preterm      AB      Living  3     SAB      TAB      Ectopic      Multiple      Live Births  3           Past Medical History:  Diagnosis Date  . Congenital hypothyroidism   . PONV (postoperative nausea and vomiting)     Past Surgical History:  Procedure Laterality Date  . ABDOMINAL HERNIA REPAIR  1993, 1997  . INGUINAL HERNIA REPAIR  11/13/2018   RIGHT   . OOPHORECTOMY Right 09/2013   ovarian cysts    Family History  Problem Relation Age of Onset  . Breast cancer Mother   . Colon cancer Father   . Emphysema Maternal Grandfather    . Dementia Paternal Grandmother   . Thyroid disease Neg Hx     Social History   Tobacco Use  . Smoking status: Former Smoker    Types: Cigarettes    Quit date: 02/17/2019    Years since quitting: 0.4  . Smokeless tobacco: Never Used  . Tobacco comment: 1 CIGARETTE DAY   Substance Use Topics  . Alcohol use: No  . Drug use: No    Allergies:  Allergies  Allergen Reactions  . Dicyclomine Itching  . Penicillins Other (See Comments)    hallucinations  . Sulfa Antibiotics Other (See Comments)    hallucinations    Medications Prior to Admission  Medication Sig Dispense Refill Last Dose  . levothyroxine (SYNTHROID) 75 MCG tablet Take 3 tablets (225 mcg total) by mouth daily before breakfast. 90 tablet 3 07/19/2019 at Unknown time  . Prenatal Vit w/Fe-Methylfol-FA (PNV PO) Take by mouth.   07/19/2019 at Unknown time  . Elastic Bandages & Supports (COMFORT FIT MATERNITY SUPP SM) MISC 1 Units by Does not apply route daily as needed. (Patient not taking: Reported on 07/16/2019) 1 each 0     Review of Systems  Constitutional: Negative for chills, diaphoresis, fatigue and fever.  Respiratory: Negative for shortness of breath.   Cardiovascular: Negative for chest pain.  Gastrointestinal: Positive for abdominal pain. Negative for constipation, diarrhea, nausea and vomiting.  Genitourinary: Positive for pelvic pain. Negative for dysuria, flank pain, frequency, urgency, vaginal bleeding and vaginal discharge.  Musculoskeletal: Positive for back pain.  Neurological: Negative for dizziness, weakness, light-headedness and headaches.   Physical Exam   Blood pressure 130/79, pulse (!) 103, temperature 98.5 F (36.9 C), temperature source Oral, resp. rate 18, height 5' 2.5" (1.588 m), weight 73.8 kg, last menstrual period 11/20/2018, SpO2 98 %, currently breastfeeding.  Patient Vitals for the past 24 hrs:  BP Temp Temp src Pulse Resp SpO2 Height Weight  07/19/19 2024 130/79 98.5 F (36.9 C)  Oral (!) 103 18 98 % 5' 2.5" (1.588 m) 73.8 kg   Physical Exam  Constitutional: She is oriented to person, place, and time. She appears well-developed and well-nourished. No distress.  HENT:  Head: Normocephalic and atraumatic.  Respiratory: Effort normal.  GI: Soft. She exhibits no distension and no mass. There is no abdominal tenderness. There is no rebound and no guarding.  Genitourinary: There is no rash, tenderness or lesion on the right labia. There is no rash, tenderness or lesion on the left labia.    Genitourinary Comments: CE: FT/long/posterior, blood on glove after exam   Neurological: She is alert and oriented to person, place, and time.  Skin: Skin is warm and dry. She is not diaphoretic.  Psychiatric: She has a normal French and affect. Her behavior is normal. Judgment and thought content normal.   Results for orders placed or performed during the hospital encounter of 07/19/19 (from the past 24 hour(s))  Urinalysis, Routine w reflex microscopic     Status: Abnormal   Collection Time: 07/19/19  8:39 PM  Result Value Ref Range   Color, Urine YELLOW YELLOW   APPearance CLOUDY (A) CLEAR   Specific Gravity, Urine 1.006 1.005 - 1.030   pH 7.0 5.0 - 8.0   Glucose, UA NEGATIVE NEGATIVE mg/dL   Hgb urine dipstick NEGATIVE NEGATIVE   Bilirubin Urine NEGATIVE NEGATIVE   Ketones, ur NEGATIVE NEGATIVE mg/dL   Protein, ur NEGATIVE NEGATIVE mg/dL   Nitrite NEGATIVE NEGATIVE   Leukocytes,Ua LARGE (A) NEGATIVE   RBC / HPF 6-10 0 - 5 RBC/hpf   WBC, UA >50 (H) 0 - 5 WBC/hpf   Bacteria, UA MANY (A) NONE SEEN   Squamous Epithelial / LPF 6-10 0 - 5   Mucus PRESENT   CBC     Status: Abnormal   Collection Time: 07/19/19  9:40 PM  Result Value Ref Range   WBC 9.9 4.0 - 10.5 K/uL   RBC 4.04 3.87 - 5.11 MIL/uL   Hemoglobin 12.4 12.0 - 15.0 g/dL   HCT 16.1 (L) 09.6 - 04.5 %   MCV 88.4 80.0 - 100.0 fL   MCH 30.7 26.0 - 34.0 pg   MCHC 34.7 30.0 - 36.0 g/dL   RDW 40.9 81.1 - 91.4 %    Platelets 167 150 - 400 K/uL   nRBC 0.0 0.0 - 0.2 %   [place] Mfm Fetal Bpp Wo Non Stress  Result Date: 07/19/2019 ----------------------------------------------------------------------  OBSTETRICS REPORT                       (Signed Final 07/19/2019 01:22 pm) ---------------------------------------------------------------------- Patient Info  ID #:       [id number]                          D.O.B.:  08-07-1986 (33 yrs)  Name:  Paula French                   Visit Date: 07/18/2019 03:14 pm ---------------------------------------------------------------------- Performed By  Performed By:     Marcellina Millin          Ref. Address:      [address]                    [place]                                                              [address]  Attending:        Lin Landsman      [facility]                    MD                                        [facility]  Referred By:      [facility] ---------------------------------------------------------------------- Orders   #  Description                          Code         Ordered By   1  [place] MFM FETAL BPP WO NON              78295.62     CORENTHIAN      STRESS                                            BOOKER  ----------------------------------------------------------------------   #  Order #                    Accession #                 Episode #   1  [id number]                  [id number]                  [id number]  ---------------------------------------------------------------------- Indications   Hypothyroid (congenital)                       O99.280 E03.9   [redacted] weeks gestation of pregnancy                Z3A.34  ---------------------------------------------------------------------- Vital Signs                                                 Height:        5'2" ---------------------------------------------------------------------- Fetal Evaluation  Num Of Fetuses:          1  Fetal Heart Rate(bpm):   129  Cardiac Activity:  Observed  Presentation:            Cephalic  Placenta:                Anterior  Amniotic Fluid  AFI FV:      Within normal limits  AFI Sum(cm)     %Tile       Largest Pocket(cm)  18.15           67          5.73  RUQ(cm)       RLQ(cm)       LUQ(cm)        LLQ(cm)  4.87          4.23          5.73           3.32 ---------------------------------------------------------------------- Biophysical Evaluation  Amniotic F.V:   Within normal limits       F. Tone:         Observed  F. Movement:    Observed                   Score:           8/8  F. Breathing:   Observed ---------------------------------------------------------------------- OB History  Gravidity:    4         Term:   3        Prem:   0        SAB:   0  TOP:          0       Ectopic:  0        Living: 3 ---------------------------------------------------------------------- Gestational Age  LMP:           34w 2d        Date:  11/20/18                 EDD:   08/27/19  Best:          34w 2d     Det. By:  LMP  (11/20/18)          EDD:   08/27/19 ---------------------------------------------------------------------- Anatomy  Stomach:               Appears normal, left   Bladder:                Appears normal                         sided ---------------------------------------------------------------------- Impression  Biophysical profile 8/8  Severe hypothyroidism (resolved in the first trimester) ---------------------------------------------------------------------- Recommendations  Follow up as clinically indicated. ----------------------------------------------------------------------               Lin Landsman, MD Electronically Signed Final Report   07/19/2019 01:22 pm ----------------------------------------------------------------------  [place] Mfm Fetal Bpp Wo Non Stress  Result Date: 07/12/2019 ----------------------------------------------------------------------  OBSTETRICS REPORT                        (Signed Final 07/12/2019 07:56 am)  ---------------------------------------------------------------------- Patient Info  ID #:       [id number]                          D.O.B.:  [birth year] (33 yrs)  Name:       Paula French  Visit Date: 07/11/2019 01:37 pm ---------------------------------------------------------------------- Performed By  Performed By:     Georgie Chard        Ref. Address:      [address]                                                              [address]  Attending:        Sander Nephew      Location[facility]                    MD                                        [facility]etal Care  Referred By:      [facility] ---------------------------------------------------------------------- Orders   #  Description                          Code         Ordered By   1  [place] MFM FETAL BPP WO NON              76819.01     CORENTHIAN      STRESS                                            BOOKER  ----------------------------------------------------------------------   #  Order #                    Accession #                 Episode #   1  [id number]                  [id number]                  [id number]  ---------------------------------------------------------------------- Indications   Encounter for other antenatal screening        Z36.2   follow-up (low risk NIPS)   Hypothyroid                                    O99.280 E03.9   [redacted] weeks gestation of pregnancy                Z3A.33  ---------------------------------------------------------------------- Vital Signs  Weight (lb): 157                               Height:        5'2"  BMI:         28.71 ---------------------------------------------------------------------- Fetal Evaluation  Num Of Fetuses:          1  Fetal Heart Rate(bpm):   125  Cardiac Activity:  Observed  Presentation:            Cephalic  Placenta:                Anterior  P. Cord Insertion:       Visualized  Amniotic Fluid  AFI FV:      Within normal  limits  AFI Sum(cm)     %Tile       Largest Pocket(cm)  17.57           64          5.99  RUQ(cm)       RLQ(cm)       LUQ(cm)        LLQ(cm)  5.99          4.7           2.18           4.7 ---------------------------------------------------------------------- Biophysical Evaluation  Amniotic F.V:   Within normal limits       F. Tone:         Observed  F. Movement:    Observed                   Score:           8/8  F. Breathing:   Observed ---------------------------------------------------------------------- OB History  Gravidity:    4         Term:   3        Prem:   0        SAB:   0  TOP:          0       Ectopic:  0        Living: 3 ---------------------------------------------------------------------- Gestational Age  LMP:           33w 2d        Date:  11/20/18                 EDD:   08/27/19  Best:          33w 2d     Det. By:  LMP  (11/20/18)          EDD:   08/27/19 ---------------------------------------------------------------------- Impression  BPP 8/8 ---------------------------------------------------------------------- Recommendations  Follow up as clinically indicated ----------------------------------------------------------------------               Lin Landsman, MD Electronically Signed Final Report   07/12/2019 07:56 am ----------------------------------------------------------------------  [place] Mfm Fetal Bpp Wo Non Stress  Result Date: 07/06/2019 ----------------------------------------------------------------------  OBSTETRICS REPORT                        (Signed Final 07/06/2019 09:52 am) ---------------------------------------------------------------------- Patient Info  ID #:       [id number]                          D.O.B.:  07/08/1986 (33 yrs)  Name:       Paula French                   Visit Date: 07/04/2019 03:55 pm ---------------------------------------------------------------------- Performed By  Performed By:     Percell Boston          Ref. Address:      [address]                     [place]  [address]  Attending:        Lin Landsmanorenthian Booker      [facility]                    MD                                        [facility]etal Care  [facility] By[facility]      [facility] ---------------------------------------------------------------------- Orders   #  Description                          Code         Ordered By   1  US MFM FETAL BPP WO NON              76819.01     CORENTHIAN      STRESS                                            BOOKER  ----------------------------------------------------------------------   #  Order #                    Accession #                 Episode #   1  [id number]                  [id number]                  [id number]  ---------------------------------------------------------------------- Indications   [redacted] weeks gestation of pregnancy                Z3A.32   Encounter for other antenatal screening        Z36.2   follow-up (low risk NIPS)   Hypothyroid                                    O99.280 E03.9  ---------------------------------------------------------------------- Vital Signs                                                 Height:        5'2" ---------------------------------------------------------------------- Fetal Evaluation  Num Of Fetuses:          1  Fetal Heart Rate(bpm):   157  Cardiac Activity:        Observed  Presentation:            Cephalic  Amniotic Fluid  AFI FV:      Within normal limits  AFI Sum(cm)     %Tile       Largest Pocket(cm)  15.74           56          5.04  RUQ(cm)       RLQ(cm)       LUQ(cm)        LLQ(cm)  4.12          2.9  5.04           3.68 ---------------------------------------------------------------------- Biophysical Evaluation  Amniotic F.V:   Within normal limits       F. Tone:         Observed  F. Movement:    Observed                   Score:           8/8  F. Breathing:   Observed  ---------------------------------------------------------------------- OB History  Gravidity:    4         Term:   3        Prem:   0        SAB:   0  TOP:          0       Ectopic:  0        Living: 3 ---------------------------------------------------------------------- Gestational Age  LMP:           32w 2d        Date:  11/20/18                 EDD:   08/27/19  Best:          Paula French 2d     Det. By:  LMP  (11/20/18)          EDD:   08/27/19 ---------------------------------------------------------------------- Anatomy  Thoracic:              Appears normal         Bladder:                Appears normal  Stomach:               Appears normal, left                         sided ---------------------------------------------------------------------- Cervix Uterus Adnexa  Cervix  Not visualized (advanced GA >24wks) ---------------------------------------------------------------------- Impression  Biophysical profile 8/8  Low risk NIPS ---------------------------------------------------------------------- Recommendations  Follow up as clinically indicated. ----------------------------------------------------------------------               Lin Landsman, MD Electronically Signed Final Report   07/06/2019 09:52 am ----------------------------------------------------------------------  [place] Mfm Ob Follow Up  Result Date: 06/25/2019 ----------------------------------------------------------------------  OBSTETRICS REPORT                       (Signed Final 06/25/2019 02:27 pm) ---------------------------------------------------------------------- Patient Info  ID #:       [id number]                          D.O.B.:  03/04/1986 (33 yrs)  Name:       Paula French                   Visit Date: 06/25/2019 01:41 pm ---------------------------------------------------------------------- Performed By  Performed By:     Rennie Plowman          Ref. Address:     [address]                    [place]  [address]  Attending:        Lin Landsman      [facility]                    MD                                       [facility]  Referred By:      [facility] ---------------------------------------------------------------------- Orders   #  Description                          Code         Ordered By   1  [place] MFM OB FOLLOW UP                  [phone]     [facility]  ----------------------------------------------------------------------   #  Order #                    Accession #                 Episode #   1  [id number]                  [id number]                  [id number]  ---------------------------------------------------------------------- Indications   [redacted] weeks gestation of pregnancy                Z3A.31   Encounter for other antenatal screening        Z36.2   follow-up (low risk NIPS)   Hypothyroid                                    O99.280 E03.9  ---------------------------------------------------------------------- Vital Signs  Weight (lb): 157                               Height:        5'2"  BMI:         28.71 ---------------------------------------------------------------------- Fetal Evaluation  Num Of Fetuses:         1  Fetal Heart Rate(bpm):  145  Cardiac Activity:       Observed  Presentation:           Breech  Placenta:               Anterior  P. Cord Insertion:      Visualized, central  Amniotic Fluid  AFI FV:      Within normal limits  AFI Sum(cm)     %Tile       Largest Pocket(cm)  18.54           70          5.68  RUQ(cm)       RLQ(cm)       LUQ(cm)        LLQ(cm)  3.9           4.83          5.68           4.13 ---------------------------------------------------------------------- Biometry  BPD:  81.5  mm     G. Age:  32w 5d         87  %    CI:        74.03   %    70 - 86                                                          FL/HC:      18.4   %    19.3 - 21.3  HC:      300.8  mm     G. Age:  33w 3d         79  %    HC/AC:       1.11        0.96 - 1.17  AC:      270.1  mm     G. Age:  31w 1d         50  %    FL/BPD:     67.7   %    71 - 87  FL:       55.2  mm     G. Age:  29w 1d          4  %    FL/AC:      20.4   %    20 - 24  HUM:      48.8  mm     G. Age:  28w 5d        < 5  %  LV:        4.5  mm  ULN:      49.3  mm     G. Age:  31w 2d         42  %  TIB:        48  mm     G. Age:  29w 0d         14  %  RAD:      41.3  mm     G. Age:  28w 5d         38  %  FIB:      47.3  mm     G. Age:  29w 1d         46  %  Est. FW:    1647  gm    3 lb 10 oz      32  % ---------------------------------------------------------------------- OB History  Gravidity:    4         Term:   3        Prem:   0        SAB:   0  TOP:          0       Ectopic:  0        Living: 3 ---------------------------------------------------------------------- Gestational Age  LMP:           31w 0d        Date:  11/20/18                 EDD:   08/27/19  U/S Today:     31w 4d  EDD:   08/23/19  Best:          Bobbye Riggs31w 0d     Det. By:  LMP  (11/20/18)          EDD:   08/27/19 ---------------------------------------------------------------------- Anatomy  Cranium:               Appears normal         LVOT:                   Previously seen  Cavum:                 Appears normal         Aortic Arch:            Previously seen  Ventricles:            Appears normal         Ductal Arch:            Previously seen  Choroid Plexus:        Appears normal         Diaphragm:              Appears normal  Cerebellum:            Appears normal         Stomach:                Appears normal, left                                                                        sided  Posterior Fossa:       Appears normal         Abdomen:                Appears normal  Nuchal Fold:           Not applicable (>20    Abdominal Wall:         Appears nml (cord                         wks GA)                                        insert, abd wall)  Face:                   Orbits and profile     Cord Vessels:           Appears normal (3                         previously seen                                vessel cord)  Lips:                  Previously seen        Kidneys:  Appear normal  Palate:                Previously seen        Bladder:                Appears normal  Thoracic:              Appears normal         Spine:                  Previously seen  Heart:                 Appears normal         Upper Extremities:      Previously seen                         (4CH, axis, and                         situs)  RVOT:                  Previously seen        Lower Extremities:      Previously seen  Other:  Heels and 5th digit previously seen. Fetus appears to be a female. ---------------------------------------------------------------------- Cervix Uterus Adnexa  Cervix  Not visualized (advanced GA >24wks)  Left Ovary  Within normal limits.  Right Ovary  Not visualized.  Adnexa  No abnormality visualized. ---------------------------------------------------------------------- Impression  Normal interval growth.  Hypothyroid ---------------------------------------------------------------------- Recommendations  Follow up growth in 4 weeks. ----------------------------------------------------------------------               Lin Landsman, MD Electronically Signed Final Report   06/25/2019 02:27 pm ----------------------------------------------------------------------  MAU Course  Procedures  MDM -suspect PTL -CE on admission: FT/long/posterior -UA: cloudy/lg leuks/many bacteria, sending urine for culture -CBC: WNL (H/H 12.4/35.7) -VB after cervical exam that pt reports was not present earlier -placenta anterior, BPP yesterday 8/8 -pt had RhoGAM on 06/04/2019 -spoke with Dr. Jolayne Panther @2130  - start IV and fluids, recheck cervix in 1 hour -EFM: reactive with few variables       -baseline: 140/135       -variability: moderate       -accels: present, 15x15        -decels: few variables       -TOCO: irritability, ctx not displaying well on monitor -CE @2230 : unchanged compared to initial exam, bleeding still present, pt reports bleeding when wiping x2 while using bathroom in MAU -called and spoke with Dr. Jolayne Panther @2303 , will admit to [facility] for observation -admit to [facility] for observation for PTL  Orders Placed This Encounter  Procedures  . Culture, OB Urine    Standing Status:   Standing    Number of Occurrences:   1  . SARS Coronavirus 2 [facility] order, Performed in [facility] hospital lab) Nasopharyngeal Nasopharyngeal Swab    Standing Status:   Standing    Number of Occurrences:   1    Order Specific Question:   Is this test for diagnosis or screening    Answer:   Screening    Order Specific Question:   Symptomatic for COVID-19 as defined by CDC    Answer:   No    Order Specific Question:   Hospitalized for COVID-19    Answer:   No    Order Specific Question:  Admitted to ICU for COVID-19    Answer:   No    Order Specific Question:   Previously tested for COVID-19    Answer:   No    Order Specific Question:   Resident in a congregate (group) care setting    Answer:   Unknown    Order Specific Question:   Employed in healthcare setting    Answer:   Unknown    Order Specific Question:   Pregnant    Answer:   Yes  . Urinalysis, Routine w reflex microscopic    Standing Status:   Standing    Number of Occurrences:   1  . CBC    Standing Status:   Standing    Number of Occurrences:   1  . Diet regular Room service appropriate? Yes; Fluid consistency: Thin    Standing Status:   Standing    Number of Occurrences:   1    Order Specific Question:   Room service appropriate?    Answer:   Yes    Order Specific Question:   Fluid consistency:    Answer:   Thin  . Notify physician (specify)    Standing Status:   Standing    Number of Occurrences:   20    Order Specific Question:   Notify Physician    Answer:    for pulse less than 60 or greater than 120    Order Specific Question:   Notify Physician    Answer:   for respiratory rate less than 12 or greater than 28    Order Specific Question:   Notify Physician    Answer:   for temperature greater than 100.4    Order Specific Question:   Notify Physician    Answer:   for urinary output less than 30 ml/hr    Order Specific Question:   Notify Physician    Answer:   for systolic BP less than 80 or greater than 140    Order Specific Question:   Notify Physician    Answer:   for diastolic BP less than 40 or greater than 90  . Vital signs    While awake, respect sleep.    Standing Status:   Standing    Number of Occurrences:   1  . Defer vaginal exam for vaginal bleeding or PROM <37 weeks    Standing Status:   Standing    Number of Occurrences:   1  . Initiate Oral Care Protocol    Standing Status:   Standing    Number of Occurrences:   1  . Initiate Carrier Fluid Protocol    Standing Status:   Standing    Number of Occurrences:   1  . Place TED hose    Standing Status:   Standing    Number of Occurrences:   1    Order Specific Question:   Laterality    Answer:   Bilateral  . SCDs    Standing Status:   Standing    Number of Occurrences:   1    Order Specific Question:   Laterality    Answer:   Bilateral  . Fetal monitoring    Standing Status:   Standing    Number of Occurrences:   1  . Bed rest with bathroom privileges    Standing Status:   Standing    Number of Occurrences:   1  . Full code    Standing Status:   Standing  Number of Occurrences:   1  . Type and screen [facility]    [facility]     Standing Status:   Standing    Number of Occurrences:   1  . Insert peripheral IV    Standing Status:   Standing    Number of Occurrences:   1  . Insert peripheral IV    Standing Status:   Standing    Number of Occurrences:   1  . Admit to Inpatient (patient's expected length of stay will be  greater than 2 midnights or inpatient only procedure)    Standing Status:   Standing    Number of Occurrences:   1    Order Specific Question:   Hospital Area    Answer:   [facility] [[id number]]    Order Specific Question:   Level of Care    Answer:   Antepartum [20]    Order Specific Question:   Covid Evaluation    Answer:   Asymptomatic Screening Protocol (No Symptoms)    Order Specific Question:   Diagnosis    Answer:   Vaginal bleeding in pregnancy, third trimester [161096]    Order Specific Question:   Admitting Physician    Answer:   CONSTANT, PEGGY [[id number]]    Order Specific Question:   Attending Physician    Answer:   CONSTANT, PEGGY [[id number]]    Order Specific Question:   Estimated length of stay    Answer:   inpatient only procedure    Order Specific Question:   Certification:    Answer:   I certify this patient is being admitted for an inpatient-only procedure    Order Specific Question:   PT Class (Do Not Modify)    Answer:   Inpatient [101]    Order Specific Question:   PT Acc Code (Do Not Modify)    Answer:   Private [1]   Meds ordered this encounter  Medications  . lactated ringers bolus 1,000 mL  . levothyroxine (SYNTHROID) tablet 225 mcg  . acetaminophen (TYLENOL) tablet 650 mg  . docusate sodium (COLACE) capsule 100 mg  . calcium carbonate (TUMS - dosed in mg elemental calcium) chewable tablet 400 mg of elemental calcium  . prenatal multivitamin tablet 1 tablet  . lactated ringers infusion  . betamethasone acetate-betamethasone sodium phosphate (CELESTONE) injection 12 mg  . NIFEdipine (PROCARDIA-XL/NIFEDICAL-XL) 24 hr tablet 30 mg   Assessment and Plan   -admit to [facility] for Observation  Odie Sera  07/19/2019, 11:27 PM

## 2019-07-19 NOTE — MAU Note (Signed)
Pt reports contractions for the last 3 hours that are getting more painful, radiating to back. Reports every 5-7 mins. Rates 3/10. Reports that she feels like she needs to pee, but "doesn't feel like she's is peeing, every time I have a contraction." Denies vaginal bleeding. Reports good fetal movement. Hx of PTL with previous pregnancy.

## 2019-07-19 NOTE — H&P (Signed)
Paula French Alpert is a 33 y.o. female presenting for Paula French is a 33 y.o. [phone] at 3196w3d who presents to MAU for ctx. CTX palpable by provider at bedside, not well-accounted for on monitor.  Onset: 1600 today Location: abdomen Duration: ~5hrs Character: stomach tightening "like a rock," low back cramping (which appears first), pt reports the ctx she had upon provider entering room was the first time she felt a sensation of "pelvic cramping" like she is used to with her previous pregnancies Aggravating/Associated: none/none Relieving: none Treatment: water, left sided-lying, ate dinner (1930), walking Severity: 5/10 at worst  Pt denies VB, LOF, decreased FM, vaginal discharge/odor/itching. Pt denies N/V, abdominal pain, constipation, diarrhea, or urinary problems. Pt denies fever, chills, fatigue, sweating or changes in appetite. Pt denies SOB or chest pain. Pt denies dizziness, HA, light-headedness, weakness.  Problems this pregnancy include: Rh NEGATIVE, hypothyroidism. Allergies? Dicyclomine, PCN, Sulfa Current medications/supplements? Synthroid 225mcg, PNVs Prenatal care provider? FEMINA, next appt 07/30/2019  OB History    Gravida  4   Para  3   Term  3   Preterm      AB      Living  3     SAB      TAB      Ectopic      Multiple      Live Births  3          Past Medical History:  Diagnosis Date  . Congenital hypothyroidism   . PONV (postoperative nausea and vomiting)    Past Surgical History:  Procedure Laterality Date  . ABDOMINAL HERNIA REPAIR  1993, 1997  . INGUINAL HERNIA REPAIR  11/13/2018   RIGHT   . OOPHORECTOMY Right 09/2013   ovarian cysts   Family History: family history includes Breast cancer in her mother; Colon cancer in her father; Dementia in her paternal grandmother; Emphysema in her maternal grandfather. Social History:  reports that she quit smoking about 5 months ago. Her smoking use included cigarettes. She has never  used smokeless tobacco. She reports that she does not drink alcohol or use drugs.    Maternal Diabetes: No Genetic Screening: Normal Maternal Ultrasounds/Referrals: Normal, put having serial US d/t abnormal thyroid levels during pregnancy Fetal Ultrasounds or other Referrals:  None Maternal Substance Abuse:  No Significant Maternal Medications:  Meds include: Syntroid 225mcg Significant Maternal Lab Results:  Rh negative RhoGAM on 06/04/2019 Other Comments:  None  Review of Systems  Constitutional: Negative for chills and diaphoresis.  Respiratory: Negative for shortness of breath.   Cardiovascular: Negative for chest pain.  Gastrointestinal: Negative for abdominal pain, constipation, diarrhea, nausea and vomiting.  Genitourinary: Negative for dysuria, frequency and urgency.  Musculoskeletal: Positive for back pain.  Neurological: Negative for dizziness, weakness and headaches.   Maternal Medical History:  Reason for admission: Contractions and vaginal bleeding.  Nausea.  Contractions: Onset was 3-5 hours ago.   Frequency: regular.   Perceived severity is moderate.    Fetal activity: Perceived fetal activity is normal.   Last perceived fetal movement was within the past hour.    Prenatal complications: Bleeding and preterm labor.     Dilation: Fingertip Effacement (%): Thick Exam by:: N. Nugent, NP Blood pressure 130/79, pulse (!) 103, temperature 98.5 F (36.9 C), temperature source Oral, resp. rate 18, height 5' 2.5" (1.588 m), weight 73.8 kg, last menstrual period 11/20/2018, SpO2 98 %, currently breastfeeding.   Patient Vitals for the past 24 hrs:  BP Temp Temp src  Pulse Resp SpO2 Height Weight  07/19/19 2024 130/79 98.5 F (36.9 C) Oral (!) 103 18 98 % 5' 2.5" (1.588 m) 73.8 kg    Maternal Exam:  Uterine Assessment: Contraction strength is moderate.  Contraction frequency is regular.   Abdomen: Patient reports no abdominal tenderness.   Physical Exam   Prenatal labs: ABO, Rh: O/Negative/-- (03/03 1628) Antibody: Negative (03/03 1628) Rubella: 7.03 (03/03 1628) RPR: Non Reactive (07/02 1059)  HBsAg: Negative (03/03 1628)  HIV: Non Reactive (07/02 1059)  GBS:   not yet performed  Assessment/Plan:  -admit to [facility] for observation of vaginal bleeding and PTD   Paula French 07/19/2019, 11:26 PM

## 2019-07-20 ENCOUNTER — Inpatient Hospital Stay (HOSPITAL_COMMUNITY): Payer: BC Managed Care – PPO

## 2019-07-20 DIAGNOSIS — E039 Hypothyroidism, unspecified: Secondary | ICD-10-CM

## 2019-07-20 DIAGNOSIS — Z3A34 34 weeks gestation of pregnancy: Secondary | ICD-10-CM

## 2019-07-20 DIAGNOSIS — O4693 Antepartum hemorrhage, unspecified, third trimester: Secondary | ICD-10-CM

## 2019-07-20 DIAGNOSIS — O99283 Endocrine, nutritional and metabolic diseases complicating pregnancy, third trimester: Secondary | ICD-10-CM

## 2019-07-20 DIAGNOSIS — O9989 Other specified diseases and conditions complicating pregnancy, childbirth and the puerperium: Secondary | ICD-10-CM

## 2019-07-20 DIAGNOSIS — O4703 False labor before 37 completed weeks of gestation, third trimester: Secondary | ICD-10-CM

## 2019-07-20 LAB — SARS CORONAVIRUS 2 BY RT PCR (HOSPITAL ORDER, PERFORMED IN ~~LOC~~ HOSPITAL LAB): SARS Coronavirus 2: NEGATIVE

## 2019-07-20 MED ORDER — ZOLPIDEM TARTRATE 5 MG PO TABS
5.0000 mg | ORAL_TABLET | Freq: Every evening | ORAL | Status: DC | PRN
  Administered 2019-07-20: 23:00:00 5 mg via ORAL
  Filled 2019-07-20: qty 1

## 2019-07-20 NOTE — Progress Notes (Signed)
Patient ID: Paula French, female   DOB: [birth year], 33 y.o.   MRN: [id number] FACULTY PRACTICE ANTEPARTUM(COMPREHENSIVE) NOTE  Paula MoodMorgan Copelin is a 33 y.o. [phone] at 4272w4d  who is admitted for observation for preterm labor and vaginal bleeding..    Fetal presentation is cephalic. Length of Stay:  1  Days  Date of admission:07/19/2019  Subjective: Patient reports lower abdominal cramping pain which is improved from her contractions earlier during her admission. She reports persistent vaginal bleeding. Patient is without any complaints Patient reports the fetal movement as active. Patient reports uterine contraction  activity as none. Patient reports  vaginal bleeding as less flow than a normal period. Patient describes fluid per vagina as None.  Vitals:  Blood pressure 112/60, pulse 70, temperature 98.5 F (36.9 C), temperature source Oral, resp. rate 18, height 5' 2.5" (1.588 m), weight 73.8 kg, last menstrual period 11/20/2018, SpO2 99 %, currently breastfeeding. Vitals:   07/19/19 2024 07/19/19 2312 07/20/19 0003 07/20/19 0602  BP: 130/79  125/87 112/60  Pulse: (!) 103  90 70  Resp: 18  20 18   Temp: 98.5 F (36.9 C)     TempSrc: Oral     SpO2: 98% (!) 0% 99%   Weight: 73.8 kg     Height: 5' 2.5" (1.588 m)      Physical Examination: GENERAL: Well-developed, well-nourished female in no acute distress.  LUNGS: Clear to auscultation bilaterally.  HEART: Regular rate and rhythm. ABDOMEN: Soft, nontender, gravid PELVIC: Not performed EXTREMITIES: No cyanosis, clubbing, or edema, 2+ distal pulses.    Fetal Monitoring:  Baseline: 120 bpm, Variability: Good {> 6 bpm), Accelerations: Reactive, Decelerations: Absent and urine irritability   reactive  Labs:  Results for orders placed or performed during the hospital encounter of 07/19/19 (from the past 24 hour(s))  Urinalysis, Routine w reflex microscopic   Collection Time: 07/19/19  8:39 PM  Result Value Ref Range   Color, Urine  YELLOW YELLOW   APPearance CLOUDY (A) CLEAR   Specific Gravity, Urine 1.006 1.005 - 1.030   pH 7.0 5.0 - 8.0   Glucose, UA NEGATIVE NEGATIVE mg/dL   Hgb urine dipstick NEGATIVE NEGATIVE   Bilirubin Urine NEGATIVE NEGATIVE   Ketones, ur NEGATIVE NEGATIVE mg/dL   Protein, ur NEGATIVE NEGATIVE mg/dL   Nitrite NEGATIVE NEGATIVE   Leukocytes,Ua LARGE (A) NEGATIVE   RBC / HPF 6-10 0 - 5 RBC/hpf   WBC, UA >50 (H) 0 - 5 WBC/hpf   Bacteria, UA MANY (A) NONE SEEN   Squamous Epithelial / LPF 6-10 0 - 5   Mucus PRESENT   CBC   Collection Time: 07/19/19  9:40 PM  Result Value Ref Range   WBC 9.9 4.0 - 10.5 K/uL   RBC 4.04 3.87 - 5.11 MIL/uL   Hemoglobin 12.4 12.0 - 15.0 g/dL   HCT 14.735.7 (L) 82.936.0 - 56.246.0 %   MCV 88.4 80.0 - 100.0 fL   MCH 30.7 26.0 - 34.0 pg   MCHC 34.7 30.0 - 36.0 g/dL   RDW 13.012.7 86.511.5 - 78.415.5 %   Platelets 167 150 - 400 K/uL   nRBC 0.0 0.0 - 0.2 %  SARS Coronavirus 2 [facility](Hospital order[facility] Nasopharyngeal Nasopharyngeal Swab   Collection Time: 07/19/19 11:32 PM   Specimen: Nasopharyngeal Swab  Result Value Ref Range   SARS Coronavirus 2 NEGATIVE NEGATIVE  [facility] and screen [facility]   Collection Time: 07/19/19 11:42 PM  Result Value Ref Range  ABO/RH(D) O NEG    Antibody Screen POS    Sample Expiration 07/22/2019,2359    Antibody Identification PASSIVELY ACQUIRED ANTI-D    Unit Number [id number]    Blood Component Type RED CELLS,LR    Unit division 00    Status of Unit ALLOCATED    Transfusion Status OK TO TRANSFUSE    Crossmatch Result COMPATIBLE    Unit Number [id number]    Blood Component Type RED CELLS,LR    Unit division 00    Status of Unit ALLOCATED    Transfusion Status OK TO TRANSFUSE    Crossmatch Result COMPATIBLE   BPAM RBC   Collection Time: 07/19/19 11:42 PM  Result Value Ref Range   Blood Product Unit Number [id number]    Unit Type and Rh 9500    Blood Product Expiration Date  [id number]    Blood Product Unit Number [id number]    Unit Type and Rh 9500    Blood Product Expiration Date [id number]     Imaging Studies:    Currently EPIC will not allow sonographic studies to automatically populate into notes.  In the meantime, copy and paste results into note or free text.  Medications:  Scheduled . betamethasone acetate-betamethasone sodium phosphate  12 mg Intramuscular Q24H  . docusate sodium  100 mg Oral Daily  . famotidine  20 mg Oral Daily  . levothyroxine  225 mcg Oral QAC breakfast  . NIFEdipine  30 mg Oral BID  . prenatal multivitamin  1 tablet Oral Q1200   I have reviewed the patient's current medications.  ASSESSMENT: [id number] [redacted]w[redacted]d Estimated Date of Delivery: 08/27/19  Patient Active Problem List   Diagnosis Date Noted  . Vaginal bleeding in pregnancy, third trimester 07/19/2019  . H/O hernia repair 06/04/2019  . Supervision of other normal pregnancy, antepartum 02/04/2019  . Tobacco smoking affecting pregnancy 02/04/2019  . Congenital hypothyroidism 07/21/2014  . Rh negative state in antepartum period 07/21/2014  . Ovary absent 07/21/2014    PLAN: Patient to complete BMZ today Continue tocolysis with procardia Limited US today to assess placenta Continue close monitoring  Taiwan Millon 07/20/2019,7:37 AM

## 2019-07-20 NOTE — Progress Notes (Signed)
Per Md ok to give procardia a little early.

## 2019-07-20 NOTE — Plan of Care (Signed)

## 2019-07-20 NOTE — Progress Notes (Signed)
Pt stated she did not have much of sleep because she had to wake up to urinate due to the I.v. fluids. She also stated she just started to feel more crampy to her back and rated her pain scale to her back to about 3 -4/10. She said she had blood in her urine and a smear when she wiped. Looking at her urine she had a size of less than a dime pinkish tissue drop in her urine. Rn gave her a pad and told her to save any blood that she sees so we can assess. Gave her heat pads for her back and medication for discomfort. Will continue to monitor.

## 2019-07-21 LAB — CULTURE, OB URINE: Culture: 30000 — AB

## 2019-07-21 MED ORDER — NIFEDIPINE ER 30 MG PO TB24: 30.0000 mg | ORAL_TABLET | Freq: Two times a day (BID) | ORAL | 1 refills | Status: DC

## 2019-07-21 NOTE — Discharge Instructions (Signed)
Preterm Labor and Birth Information °Pregnancy normally lasts 39-41 weeks. Preterm labor is when labor starts early. It starts before you have been pregnant for 37 whole weeks. °What are the risk factors for preterm labor? °Preterm labor is more likely to occur in women who: °· Have an infection while pregnant. °· Have a cervix that is short. °· Have gone into preterm labor before. °· Have had surgery on their cervix. °· Are younger than age 32. °· Are older than age 35. °· Are African American. °· Are pregnant with two or more babies. °· Take street drugs while pregnant. °· Smoke while pregnant. °· Do not gain enough weight while pregnant. °· Got pregnant right after another pregnancy. °What are the symptoms of preterm labor? °Symptoms of preterm labor include: °· Cramps. The cramps may feel like the cramps some women get during their period. The cramps may happen with watery poop (diarrhea). °· Pain in the belly (abdomen). °· Pain in the lower back. °· Regular contractions or tightening. It may feel like your belly is getting tighter. °· Pressure in the lower belly that seems to get stronger. °· More fluid (discharge) leaking from the vagina. The fluid may be watery or bloody. °· Water breaking. °Why is it important to notice signs of preterm labor? °Babies who are born early may not be fully developed. They have a higher chance for: °· Long-term heart problems. °· Long-term lung problems. °· Trouble controlling body systems, like breathing. °· Bleeding in the brain. °· A condition called cerebral palsy. °· Learning difficulties. °· Death. °These risks are highest for babies who are born before 34 weeks of pregnancy. °How is preterm labor treated? °Treatment depends on: °· How long you were pregnant. °· Your condition. °· The health of your baby. °Treatment may involve: °· Having a stitch (suture) placed in your cervix. When you give birth, your cervix opens so the baby can come out. The stitch keeps the cervix  from opening too soon. °· Staying at the hospital. °· Taking or getting medicines, such as: °? Hormone medicines. °? Medicines to stop contractions. °? Medicines to help the baby’s lungs develop. °? Medicines to prevent your baby from having cerebral palsy. °What should I do if I am in preterm labor? °If you think you are going into labor too soon, call your doctor right away. °How can I prevent preterm labor? °· Do not use any tobacco products. °? Examples of these are cigarettes, chewing tobacco, and e-cigarettes. °? If you need help quitting, ask your doctor. °· Do not use street drugs. °· Do not use any medicines unless you ask your doctor if they are safe for you. °· Talk with your doctor before taking any herbal supplements. °· Make sure you gain enough weight. °· Watch for infection. If you think you might have an infection, get it checked right away. °· If you have gone into preterm labor before, tell your doctor. °This information is not intended to replace advice given to you by your health care provider. Make sure you discuss any questions you have with your health care provider. °Document Released: 02/16/2009 Document Revised: 03/14/2019 Document Reviewed: 04/12/2016 °Elsevier Patient Education © 2020 [employer] ° °

## 2019-07-21 NOTE — Discharge Summary (Signed)
Patient ID: Paula French MRN: [id number] DOB/AGE: June 05, 1986 33 y.o.  Admit date: 07/19/2019 Discharge date: 07/21/2019  Admission Diagnoses: IUP 34 5/7 weeks, PTL  Discharge Diagnoses: SAA  Prenatal Procedures: none  Consults: Neonatology  Hospital Course:  This is a 33 y.o. [id number] with IUP at [redacted]w[redacted]d admitted for PTL. Marland Kitchen She was admitted with contractions, noted to have a cervical exam of closed on day DOA but had some vaginal spotting.  She denied LOF. She wa received betamethasone x 2 doses. She was started on Procardia. She was seen by Neonatology during her stay.  She was observed, fetal heart rate monitoring remained reassuring, and she had no signs/symptoms of progressing preterm labor or other maternal-fetal concerns.  Her cervical exam was unchanged from admission.  She was deemed stable for discharge to home with outpatient follow up.  Discharge Exam: Temp:  [98.1 F (36.7 C)-98.4 F (36.9 C)] 98.1 F (36.7 C) (08/16 2210) Pulse Rate:  [82-96] 89 (08/16 2210) Resp:  [18-20] 18 (08/16 2210) BP: (104-118)/(63-71) 114/66 (08/16 2210) SpO2:  [99 %-100 %] 99 % (08/16 2210)   Physical Examination: CONSTITUTIONAL: Well-developed, well-nourished female in no acute distress.  HENT:  Normocephalic, atraumatic, External right and left ear normal. Oropharynx is clear and moist EYES: Conjunctivae and EOM are normal. Pupils are equal, round, and reactive to light. No scleral icterus.  NECK: Normal range of motion, supple, no masses SKIN: Skin is warm and dry. No rash noted. Not diaphoretic. No erythema. No pallor. [place]: Alert and oriented to person, place, and time. Normal reflexes, muscle tone coordination. No cranial nerve deficit noted. PSYCHIATRIC: Normal mood and affect. Normal behavior. Normal judgment and thought content. CARDIOVASCULAR: Normal heart rate noted, regular rhythm RESPIRATORY: Effort and breath sounds normal, no problems with respiration noted MUSCULOSKELETAL:  Normal range of motion. No edema and no tenderness. 2+ distal pulses. ABDOMEN: Soft, nontender, nondistended, gravid. CERVIX: Dilation: Fingertip Effacement (%): Thick Cervical Position: Posterior Exam by:: N. Nugent, NP  Fetal monitoring: FHR: 140 bpm, Variability: moderate, Accelerations: Present, Decelerations: Absent  Uterine activity: occ irrability  Significant Diagnostic Studies:  Results for orders placed or performed during the hospital encounter of 07/19/19 (from the past 168 hour(s))  Culture, OB Urine   Collection Time: 07/19/19  8:10 AM   Specimen: Urine, Random  Result Value Ref Range   Specimen Description URINE, RANDOM    Special Requests NONE    Culture (A)     30,000 COLONIES/mL MULTIPLE SPECIES PRESENT, SUGGEST RECOLLECTION NO GROUP B STREP (S.AGALACTIAE) ISOLATED Performed at [facility], [address] [place], [place] [address]    Report Status 07/21/2019 FINAL   Urinalysis, Routine w reflex microscopic   Collection Time: 07/19/19  8:39 PM  Result Value Ref Range   Color, Urine YELLOW YELLOW   APPearance CLOUDY (A) CLEAR   Specific Gravity, Urine 1.006 1.005 - 1.030   pH 7.0 5.0 - 8.0   Glucose, UA NEGATIVE NEGATIVE mg/dL   Hgb urine dipstick NEGATIVE NEGATIVE   Bilirubin Urine NEGATIVE NEGATIVE   Ketones, ur NEGATIVE NEGATIVE mg/dL   Protein, ur NEGATIVE NEGATIVE mg/dL   Nitrite NEGATIVE NEGATIVE   Leukocytes,Ua LARGE (A) NEGATIVE   RBC / HPF 6-10 0 - 5 RBC/hpf   WBC, UA >50 (H) 0 - 5 WBC/hpf   Bacteria, UA MANY (A) NONE SEEN   Squamous Epithelial / LPF 6-10 0 - 5   Mucus PRESENT   CBC   Collection Time: 07/19/19  9:40 PM  Result Value Ref Range   WBC 9.9 4.0 - 10.5 K/uL   RBC 4.04 3.87 - 5.11 MIL/uL   Hemoglobin 12.4 12.0 - 15.0 g/dL   HCT 78.235.7 (L) 95.636.0 - 21.346.0 %   MCV 88.4 80.0 - 100.0 fL   MCH 30.7 26.0 - 34.0 pg   MCHC 34.7 30.0 - 36.0 g/dL   RDW 08.612.7 57.811.5 - 46.915.5 %   Platelets 167 150 - 400 K/uL   nRBC 0.0 0.0 - 0.2 %  SARS  Coronavirus 2 [facility](Hospital order, Performed [facility]) Nasopharyngeal Nasopharyngeal Swab   Collection Time: 07/19/19 11:32 PM   Specimen: Nasopharyngeal Swab  Result Value Ref Range   SARS Coronavirus 2 NEGATIVE NEGATIVE  Type and screen [facility]   Collection Time: 07/19/19 11:42 PM  Result Value Ref Range   ABO/RH(D) O NEG    Antibody Screen POS    Sample Expiration 07/22/2019,2359    Antibody Identification PASSIVELY ACQUIRED ANTI-D    Unit Number [id number]    Blood Component Type RED CELLS,LR    Unit division 00    Status of Unit ALLOCATED    Transfusion Status OK TO TRANSFUSE    Crossmatch Result COMPATIBLE    Unit Number [id number]    Blood Component Type RED CELLS,LR    Unit division 00    Status of Unit ALLOCATED    Transfusion Status OK TO TRANSFUSE    Crossmatch Result COMPATIBLE   BPAM RBC   Collection Time: 07/19/19 11:42 PM  Result Value Ref Range   Blood Product Unit Number [id number]    Unit Type and Rh 9500    Blood Product Expiration Date 433295188416202009192359    Blood Product Unit Number [id number]    Unit Type and Rh 9500    Blood Product Expiration Date 355732202542202009182359   Results for orders placed or performed in visit on 07/16/19 (from the past 168 hour(s))  TSH   Collection Time: 07/16/19  2:16 PM  Result Value Ref Range   TSH 0.135 (L) 0.450 - 4.500 uIU/mL    Discharge Condition: Stable  Disposition: Discharge disposition: 01-Home or Self Care        Discharge Instructions    Discharge activity:  No Restrictions   Complete by: As directed    Discharge diet:  No restrictions   Complete by: As directed    Do not have sex or do anything that might make you have an orgasm   Complete by: As directed    Fetal Kick Count:  Lie on our left side for one hour after a meal, and count the number of times your baby kicks.  If it is less than 5 times, get up, move around and drink some juice.  Repeat the test 30  minutes later.  If it is still less than 5 kicks in an hour, notify your doctor.   Complete by: As directed    Notify physician for a general feeling that "something is not right"   Complete by: As directed    Notify physician for increase or change in vaginal discharge   Complete by: As directed    Notify physician for intestinal cramps, with or without diarrhea, sometimes described as "gas pain"   Complete by: As directed    Notify physician for leaking of fluid   Complete by: As directed    Notify physician for low, dull backache, unrelieved by heat or Tylenol   Complete by: As directed  Notify physician for menstrual like cramps   Complete by: As directed    Notify physician for pelvic pressure   Complete by: As directed    Notify physician for uterine contractions.  These may be painless and feel like the uterus is tightening or the baby is  "balling up"   Complete by: As directed    Notify physician for vaginal bleeding   Complete by: As directed    PRETERM LABOR:  Includes any of the follwing symptoms that occur between 20 - [redacted] weeks gestation.  If these symptoms are not stopped, preterm labor can result in preterm delivery, placing your baby at risk   Complete by: As directed      Allergies as of 07/21/2019      Reactions   Dicyclomine Itching   Penicillins Other (See Comments)   hallucinations   Sulfa Antibiotics Other (See Comments)   hallucinations      Medication List    TAKE these medications   Comfort Fit Maternity Supp Sm Misc 1 Units by Does not apply route daily as needed.   levothyroxine 75 MCG tablet Commonly known as: Synthroid Take 3 tablets (225 mcg total) by mouth daily before breakfast.   NIFEdipine 30 MG 24 hr tablet Commonly known as: ADALAT CC Take 1 tablet (30 mg total) by mouth 2 (two) times daily.   PNV PO Take by mouth.      Follow-up Information    [facility] Follow up.   Why: Pt laready has U/S appt 07/25/19 and OB  visit on 07/30/19 Contact information: [address] [place] [id number] [phone]          Signed: Hermina StaggersMichael L Gradie Butrick M.D. 07/21/2019, 9:12 AM

## 2019-07-23 LAB — TYPE AND SCREEN
ABO/RH(D): O NEG
Antibody Screen: POSITIVE
Unit division: 0
Unit division: 0

## 2019-07-23 LAB — BPAM RBC
Blood Product Expiration Date: 202009182359
Blood Product Expiration Date: 202009192359
Unit Type and Rh: 9500
Unit Type and Rh: 9500

## 2019-07-25 ENCOUNTER — Encounter (HOSPITAL_COMMUNITY): Payer: Self-pay

## 2019-07-25 ENCOUNTER — Other Ambulatory Visit: Payer: Self-pay

## 2019-07-25 ENCOUNTER — Ambulatory Visit (HOSPITAL_COMMUNITY): Payer: BC Managed Care – PPO | Admitting: *Deleted

## 2019-07-25 ENCOUNTER — Ambulatory Visit (HOSPITAL_COMMUNITY)
Admission: RE | Admit: 2019-07-25 | Discharge: 2019-07-25 | Disposition: A | Discharge status: Home / Self Care | Payer: BC Managed Care – PPO | Source: Ambulatory Visit | Attending: Obstetrics and Gynecology | Admitting: Obstetrics and Gynecology

## 2019-07-25 DIAGNOSIS — Z8719 Personal history of other diseases of the digestive system: Secondary | ICD-10-CM | POA: Insufficient documentation

## 2019-07-25 DIAGNOSIS — Z3A35 35 weeks gestation of pregnancy: Secondary | ICD-10-CM

## 2019-07-25 DIAGNOSIS — Z9889 Other specified postprocedural states: Secondary | ICD-10-CM | POA: Insufficient documentation

## 2019-07-25 DIAGNOSIS — O99283 Endocrine, nutritional and metabolic diseases complicating pregnancy, third trimester: Secondary | ICD-10-CM

## 2019-07-25 DIAGNOSIS — Z362 Encounter for other antenatal screening follow-up: Secondary | ICD-10-CM | POA: Diagnosis not present

## 2019-07-25 DIAGNOSIS — O99280 Endocrine, nutritional and metabolic diseases complicating pregnancy, unspecified trimester: Secondary | ICD-10-CM | POA: Diagnosis present

## 2019-07-25 DIAGNOSIS — E039 Hypothyroidism, unspecified: Secondary | ICD-10-CM | POA: Diagnosis present

## 2019-07-30 ENCOUNTER — Ambulatory Visit (INDEPENDENT_AMBULATORY_CARE_PROVIDER_SITE_OTHER): Payer: BC Managed Care – PPO | Admitting: Certified Nurse Midwife

## 2019-07-30 ENCOUNTER — Other Ambulatory Visit: Payer: Self-pay

## 2019-07-30 ENCOUNTER — Other Ambulatory Visit (HOSPITAL_COMMUNITY)
Admission: RE | Admit: 2019-07-30 | Discharge: 2019-07-30 | Disposition: A | Discharge status: Home / Self Care | Payer: BC Managed Care – PPO | Source: Ambulatory Visit | Attending: Certified Nurse Midwife | Admitting: Certified Nurse Midwife

## 2019-07-30 VITALS — BP 122/80 | HR 99 | Wt 163.4 lb

## 2019-07-30 DIAGNOSIS — Z3480 Encounter for supervision of other normal pregnancy, unspecified trimester: Secondary | ICD-10-CM

## 2019-07-30 DIAGNOSIS — Z3A36 36 weeks gestation of pregnancy: Secondary | ICD-10-CM

## 2019-07-30 DIAGNOSIS — Z3483 Encounter for supervision of other normal pregnancy, third trimester: Secondary | ICD-10-CM

## 2019-07-30 DIAGNOSIS — E031 Congenital hypothyroidism without goiter: Secondary | ICD-10-CM

## 2019-07-30 NOTE — Progress Notes (Signed)
Pt is here for ROB. [redacted]w[redacted]d.

## 2019-07-30 NOTE — Progress Notes (Signed)
   PRENATAL VISIT NOTE  Subjective:  Paula French is a 33 y.o. [phone] at [redacted]w[redacted]d being seen today for ongoing prenatal care.  She is currently monitored for the following issues for this high-risk pregnancy and has Congenital hypothyroidism; Rh negative state in antepartum period; Ovary absent; Supervision of other normal pregnancy, antepartum; Tobacco smoking affecting pregnancy; H/O hernia repair; Vaginal bleeding in pregnancy, third trimester; and Preterm labor in third trimester on their problem list.  Patient reports occasional contractions.  Contractions: Irritability. Vag. Bleeding: None.  Movement: Present. Denies leaking of fluid.   The following portions of the patient's history were reviewed and updated as appropriate: allergies, current medications, past family history, past medical history, past social history, past surgical history and problem list.   Objective:   Vitals:   07/30/19 1605  BP: 122/80  Pulse: 99  Weight: 163 lb 6.4 oz (74.1 kg)    Fetal Status: Fetal Heart Rate (bpm): 155 Fundal Height: 33 cm Movement: Present  Presentation: Vertex  General:  Alert, oriented and cooperative. Patient is in no acute distress.  Skin: Skin is warm and dry. No rash noted.   Cardiovascular: Normal heart rate noted  Respiratory: Normal respiratory effort, no problems with respiration noted  Abdomen: Soft, gravid, appropriate for gestational age.  Pain/Pressure: Present     Pelvic: Cervical exam performed Dilation: 1 Effacement (%): Thick Station: -3  Extremities: Normal range of motion.  Edema: Trace  Mental Status: Normal mood and affect. Normal behavior. Normal judgment and thought content.   Assessment and Plan:  Pregnancy: G4P3003 at [redacted]w[redacted]d 1. Supervision of other normal pregnancy, antepartum - Patient doing well, no complaints - Routine prenatal care - Anticipatory guidance on upcoming appointments  - Educated and discussed use of EPO, RRT and IC starting at 37 weeks to  induce cervical ripening  - Instructed to stop use of procardia at this time  - COVID 19 precautions, patient is working from home  - Reviewed safety, visitor policy, reassurance about COVID-19 for pregnancy at this time. Discussed possible changes to visits, including televisits, that may occur due to COVID-19.  The office remains open if pt needs to be seen and MAU is open 24 hours/day for OB emergencies. - Cervicovaginal ancillary only( [place]) - Strep Gp B Culture+Rflx  2. Congenital hypothyroidism - Follow up ultrasounds normal  -Est. FW:  2413  gm    5 lb 5 oz  23  % at 35.2 weeks  - no additional f/u US needed per MFM  -  Discussed recent TSH with Dr Rosana Hoes, recommends additional TSH, T3 and T4 to assess hypothyroidism  - TSH - T3, free - T4, free  Preterm labor symptoms and general obstetric precautions including but not limited to vaginal bleeding, contractions, leaking of fluid and fetal movement were reviewed in detail with the patient. Please refer to After Visit Summary for other counseling recommendations.   Return in about 2 weeks (around 08/13/2019) for ROB-mychart.  Future Appointments  Date Time Provider [place]  08/13/2019  3:45 PM Shelly Bombard, MD [place] None    Lajean Manes, CNM

## 2019-07-30 NOTE — Patient Instructions (Addendum)
Reasons to return to MAU:  1.  Contractions are  5 minutes apart or less, each last 1 minute, these have been going on for 1-2 hours, and you cannot walk or talk during them 2.  You have a large gush of fluid, or a trickle of fluid that will not stop and you have to wear a pad 3.  You have bleeding that is bright red, heavier than spotting--like menstrual bleeding (spotting can be normal in early labor or after a check of your cervix)  4.  You do not feel the baby moving like he/she normally does   Cervical Ripening: May try one or both starting at 37 weeks   Red Raspberry Leaf capsules:  two 300mg  or 400mg  tablets with each meal, 2-3 times a day  Potential Side Effects Of Raspberry Leaf:  Most women do not experience any side effects from drinking raspberry leaf tea. However, nausea and loose stools are possible   Evening Primrose Oil capsules: may take 1 to 3 capsules daily. May also prick one to release the oil and insert it into your vagina at night.  Some of the potential side effects:  Upset stomach  Loose stools or diarrhea  Headaches  Nausea:    Intercourse

## 2019-07-31 LAB — T4, FREE: Free T4: 1.48 ng/dL (ref 0.82–1.77)

## 2019-07-31 LAB — T3, FREE: T3, Free: 3.3 pg/mL (ref 2.0–4.4)

## 2019-07-31 LAB — TSH: TSH: 0.188 u[IU]/mL — ABNORMAL LOW (ref 0.450–4.500)

## 2019-08-01 LAB — CERVICOVAGINAL ANCILLARY ONLY
Chlamydia: NEGATIVE
Neisseria Gonorrhea: NEGATIVE

## 2019-08-03 LAB — STREP GP B CULTURE+RFLX: Strep Gp B Culture+Rflx: NEGATIVE

## 2019-08-13 ENCOUNTER — Telehealth (INDEPENDENT_AMBULATORY_CARE_PROVIDER_SITE_OTHER): Payer: BC Managed Care – PPO | Admitting: Obstetrics

## 2019-08-13 ENCOUNTER — Encounter: Payer: Self-pay | Admitting: Obstetrics

## 2019-08-13 DIAGNOSIS — Z9889 Other specified postprocedural states: Secondary | ICD-10-CM

## 2019-08-13 DIAGNOSIS — O26893 Other specified pregnancy related conditions, third trimester: Secondary | ICD-10-CM

## 2019-08-13 DIAGNOSIS — Z3A38 38 weeks gestation of pregnancy: Secondary | ICD-10-CM

## 2019-08-13 DIAGNOSIS — Z8719 Personal history of other diseases of the digestive system: Secondary | ICD-10-CM

## 2019-08-13 DIAGNOSIS — O26899 Other specified pregnancy related conditions, unspecified trimester: Secondary | ICD-10-CM

## 2019-08-13 DIAGNOSIS — Z3480 Encounter for supervision of other normal pregnancy, unspecified trimester: Secondary | ICD-10-CM

## 2019-08-13 DIAGNOSIS — Z6791 Unspecified blood type, Rh negative: Secondary | ICD-10-CM

## 2019-08-13 DIAGNOSIS — E031 Congenital hypothyroidism without goiter: Secondary | ICD-10-CM

## 2019-08-13 NOTE — Progress Notes (Signed)
TELEHEALTH OBSTETRICS PRENATAL VIRTUAL VIDEO VISIT ENCOUNTER NOTE  Provider location: [facility] [place]   I connected with Paula French on 08/13/19 at  3:45 PM EDT by [facility] MyChart Video Encounter at home and verified that I am speaking with the correct person using two identifiers.   I discussed the limitations, risks, security and privacy concerns of performing an evaluation and management service virtually and the availability of in person appointments. I also discussed with the patient that there may be a patient responsible charge related to this service. The patient expressed understanding and agreed to proceed. Subjective:  Paula French is a 33 y.o. [phone] at [redacted]w[redacted]d being seen today for ongoing prenatal care.  She is currently monitored for the following issues for this low-risk pregnancy and has Congenital hypothyroidism; Rh negative state in antepartum period; Ovary absent; Supervision of other normal pregnancy, antepartum; Tobacco smoking affecting pregnancy; H/O hernia repair; Vaginal bleeding in pregnancy, third trimester; and Preterm labor in third trimester on their problem list.  Patient reports no complaints.  Contractions: Irritability. Vag. Bleeding: None.  Movement: Present. Denies any leaking of fluid.   The following portions of the patient's history were reviewed and updated as appropriate: allergies, current medications, past family history, past medical history, past social history, past surgical history and problem list.   Objective:  There were no vitals filed for this visit.  Fetal Status:     Movement: Present     General:  Alert, oriented and cooperative. Patient is in no acute distress.  Respiratory: Normal respiratory effort, no problems with respiration noted  Mental Status: Normal French and affect. Normal behavior. Normal judgment and thought content.  Rest of physical exam deferred due to type of encounter  Imaging: [place] Mfm Fetal Bpp  Wo Non Stress  Result Date: 07/25/2019 ----------------------------------------------------------------------  OBSTETRICS REPORT                       (Signed Final 07/25/2019 04:06 pm) ---------------------------------------------------------------------- Patient Info  ID #:       [id number]                          D.O.B.:  [birth year] (33 yrs)  Name:       Paula French                   Visit Date: 07/25/2019 01:57 pm ---------------------------------------------------------------------- Performed By  Performed By:     Emeline Darling BS,      Secondary Phy.:   PEGGY                    RDMS                                                             CONSTANT MD  Attending:        Noralee Space MD        Address:          Faculty  Referred By:      Lenis Dickinson               Tertiary Phy.:    [facility]  Ref. Address:     [address]        Location[facility]                    [address]                                                             Fetal Care ---------------------------------------------------------------------- Orders   #  Description                          Code         Ordered By   1  US MFM FETAL BPP WO NON              76819.01     CORENTHIAN      STRESS                                            BOOKER   2  US MFM OB FOLLOW UP                  76816.01     Lin LandsmanORENTHIAN                                                        BOOKER  ----------------------------------------------------------------------   #  Order #                    Accession #                 Episode #   1  [id number]                  [id number]   2  [id number]                  [id number]                  [id number]  ---------------------------------------------------------------------- Indications   [redacted] weeks gestation of pregnancy                Z3A.35   Hypothyroid (congenital)                       O99.280 E03.9    Encounter for other antenatal screening        Z36.2   follow-up   Low Risk NIPS   AFP Negative  ---------------------------------------------------------------------- Vital Signs  Weight (lb): 162                               Height:        5'2"  BMI:         29.63 ---------------------------------------------------------------------- Fetal Evaluation  Num Of Fetuses:  1  Fetal Heart Rate(bpm):  165  Cardiac Activity:       Observed  Presentation:           Cephalic  Placenta:               Anterior  P. Cord Insertion:      Previously Visualized  Amniotic Fluid  AFI FV:      Within normal limits  AFI Sum(cm)     %Tile       Largest Pocket(cm)  15              54          4.81  RUQ(cm)       RLQ(cm)       LUQ(cm)        LLQ(cm)  2.73          4.73          2.73           4.81 ---------------------------------------------------------------------- Biophysical Evaluation  Amniotic F.V:   Pocket => 2 cm two         F. Tone:        Observed                  planes  F. Movement:    Observed                   Score:          8/8  F. Breathing:   Observed ---------------------------------------------------------------------- Biometry  BPD:      86.4  mm     G. Age:  34w 6d         41  %    CI:        73.34   %    70 - 86                                                          FL/HC:      20.0   %    20.1 - 22.3  HC:      320.6  mm     G. Age:  36w 1d         38  %    HC/AC:      1.05        0.93 - 1.11  AC:      306.4  mm     G. Age:  34w 4d         37  %    FL/BPD:     74.2   %    71 - 87  FL:       64.1  mm     G. Age:  33w 1d          4  %    FL/AC:      20.9   %    20 - 24  Est. FW:    2413  gm      5 lb 5 oz     23  % ---------------------------------------------------------------------- OB History  Gravidity:    4         Term:   3        Prem:   0  SAB:   0  TOP:          0       Ectopic:  0        Living: 3 ---------------------------------------------------------------------- Gestational Age  LMP:            35w 2d        Date:  11/20/18                 EDD:   08/27/19  U/S Today:     34w 5d                                        EDD:   08/31/19  Best:          35w 2d     Det. By:  LMP  (11/20/18)          EDD:   08/27/19 ---------------------------------------------------------------------- Anatomy  Cranium:               Appears normal         LVOT:                   Appears normal  Cavum:                 Previously seen        Aortic Arch:            Previously seen  Ventricles:            Previously seen        Ductal Arch:            Previously seen  Choroid Plexus:        Previously seen        Diaphragm:              Previously seen  Cerebellum:            Previously seen        Stomach:                Appears normal, left                                                                        sided  Posterior Fossa:       Previously seen        Abdomen:                Appears normal  Nuchal Fold:           Not applicable (>02    Abdominal Wall:         Previously seen                         wks GA)  Face:                  Orbits and profile     Cord Vessels:           Previously seen  previously seen  Lips:                  Previously seen        Kidneys:                Appear normal  Palate:                Previously seen        Bladder:                Appears normal  Thoracic:              Appears normal         Spine:                  Previously seen  Heart:                 Appears normal         Upper Extremities:      Previously seen                         (4CH, axis, and                         situs)  RVOT:                  Appears normal         Lower Extremities:      Previously seen  Other:  Heels and 5th digit previously seen. Fetus appears to be a female. ---------------------------------------------------------------------- Cervix Uterus Adnexa  Cervix  Not visualized (advanced GA >24wks) ---------------------------------------------------------------------- Impression   Maternal hypothyroidism. Now euthyroid with levothyroxine  supplements.  Fetal growth is appropriate for gestational age. Amniotic fluid  is normal and good fetal activity is seen. Antenatal testing is  reassuring. BPP 8/8.  I reassured the patient of the findings. She reports good fetal  movements and does not have hypertension or diabetes.  Patient is taking nifedipine (tocolysis) prescribed by her  provider. She asked if she could discontinue her medication  now.  Nifedipine may provide some relief of maternal symptoms of  uterine contractions. It does not improve neonatal outcomes.  I informed her that she may discontinue the medication. ---------------------------------------------------------------------- Recommendations  -No follow-up appointments were made (euthyroid now). ----------------------------------------------------------------------                  Noralee Space, MD Electronically Signed Final Report   07/25/2019 04:06 pm ----------------------------------------------------------------------  [place] Mfm Fetal Bpp Wo Non Stress  Result Date: 07/19/2019 ----------------------------------------------------------------------  OBSTETRICS REPORT                       (Signed Final 07/19/2019 01:22 pm) ---------------------------------------------------------------------- Patient Info  ID #:       [id number]                          D.O.B.:  10/06/1986 (33 yrs)  Name:       Paula French                   Visit Date: 07/18/2019 03:14 pm ---------------------------------------------------------------------- Performed By  Performed By:     Marcellina Millin          Ref. Address:      [address]  RDMS                                                              [address]  Attending:        Lin Landsman      [facility]                    MD                                        Fetal Care  Referred By:      [facility]  ---------------------------------------------------------------------- Orders   #  Description                          Code         Ordered By   1  [place] MFM FETAL BPP WO NON              76819.01     CORENTHIAN      STRESS                                            BOOKER  ----------------------------------------------------------------------   #  Order #                    Accession #                 Episode #   1  [id number]                  [id number]                  [id number]  ---------------------------------------------------------------------- Indications   Hypothyroid (congenital)                       O99.280 E03.9   [redacted] weeks gestation of pregnancy                Z3A.34  ---------------------------------------------------------------------- Vital Signs                                                 Height:        5'2" ---------------------------------------------------------------------- Fetal Evaluation  Num Of Fetuses:          1  Fetal Heart Rate(bpm):   129  Cardiac Activity:        Observed  Presentation:            Cephalic  Placenta:                Anterior  Amniotic Fluid  AFI FV:      Within normal limits  AFI Sum(cm)     %Tile       Largest Pocket(cm)  18.15           67  5.73  RUQ(cm)       RLQ(cm)       LUQ(cm)        LLQ(cm)  4.87          4.23          5.73           3.32 ---------------------------------------------------------------------- Biophysical Evaluation  Amniotic F.V:   Within normal limits       F. Tone:         Observed  F. Movement:    Observed                   Score:           8/8  F. Breathing:   Observed ---------------------------------------------------------------------- OB History  Gravidity:    4         Term:   3        Prem:   0        SAB:   0  TOP:          0       Ectopic:  0        Living: 3 ---------------------------------------------------------------------- Gestational Age  LMP:           34w 2d        Date:  11/20/18                 EDD:   08/27/19   Best:          34w 2d     Det. By:  LMP  (11/20/18)          EDD:   08/27/19 ---------------------------------------------------------------------- Anatomy  Stomach:               Appears normal, left   Bladder:                Appears normal                         sided ---------------------------------------------------------------------- Impression  Biophysical profile 8/8  Severe hypothyroidism (resolved in the first trimester) ---------------------------------------------------------------------- Recommendations  Follow up as clinically indicated. ----------------------------------------------------------------------               Lin Landsman, MD Electronically Signed Final Report   07/19/2019 01:22 pm ----------------------------------------------------------------------  [place] Mfm Ob Follow Up  Result Date: 07/25/2019 ----------------------------------------------------------------------  OBSTETRICS REPORT                       (Signed Final 07/25/2019 04:06 pm) ---------------------------------------------------------------------- Patient Info  ID #:       [id number]                          D.O.B.:  01-03-1986 (33 yrs)  Name:       Paula French                   Visit Date: 07/25/2019 01:57 pm ---------------------------------------------------------------------- Performed By  Performed By:     Emeline Darling BS,      Secondary Phy.:   PEGGY                    RDMS  CONSTANT MD  Attending:        Noralee Space MD        Address:          Faculty  Referred By:      [facility]               Tertiary Phy.:    [facility]                                                             Care  Ref. Address:     [address]        Location[facility]         [facility]                    [address]                                                             Fetal Care ---------------------------------------------------------------------- Orders   #   Description                          Code         Ordered By   1  [place] MFM FETAL BPP WO NON              76819.01     CORENTHIAN      STRESS                                            BOOKER   2  [place] MFM OB FOLLOW UP                  16109.60     Lin Landsman  ----------------------------------------------------------------------   #  Order #                    Accession #                 Episode #   1  [id number]                  [id number]   2  [id number]                  [id number]                  [id number]  ---------------------------------------------------------------------- Indications   [redacted] weeks gestation of pregnancy  Z3A.35   Hypothyroid (congenital)                       O99.280 E03.9   Encounter for other antenatal screening        Z36.2   follow-up   Low Risk NIPS   AFP Negative  ---------------------------------------------------------------------- Vital Signs  Weight (lb): 162                               Height:        5'2"  BMI:         29.63 ---------------------------------------------------------------------- Fetal Evaluation  Num Of Fetuses:         1  Fetal Heart Rate(bpm):  165  Cardiac Activity:       Observed  Presentation:           Cephalic  Placenta:               Anterior  P. Cord Insertion:      Previously Visualized  Amniotic Fluid  AFI FV:      Within normal limits  AFI Sum(cm)     %Tile       Largest Pocket(cm)  15              54          4.81  RUQ(cm)       RLQ(cm)       LUQ(cm)        LLQ(cm)  2.73          4.73          2.73           4.81 ---------------------------------------------------------------------- Biophysical Evaluation  Amniotic F.V:   Pocket => 2 cm two         F. Tone:        Observed                  planes  F. Movement:    Observed                   Score:          8/8  F. Breathing:   Observed ----------------------------------------------------------------------  Biometry  BPD:      86.4  mm     G. Age:  34w 6d         41  %    CI:        73.34   %    70 - 86                                                          FL/HC:      20.0   %    20.1 - 22.3  HC:      320.6  mm     G. Age:  36w 1d         38  %    HC/AC:      1.05        0.93 - 1.11  AC:      306.4  mm     G. Age:  34w 4d         37  %  FL/BPD:     74.2   %    71 - 87  FL:       64.1  mm     G. Age:  33w 1d          4  %    FL/AC:      20.9   %    20 - 24  Est. FW:    2413  gm      5 lb 5 oz     23  % ---------------------------------------------------------------------- OB History  Gravidity:    4         Term:   3        Prem:   0        SAB:   0  TOP:          0       Ectopic:  0        Living: 3 ---------------------------------------------------------------------- Gestational Age  LMP:           35w 2d        Date:  11/20/18                 EDD:   08/27/19  U/S Today:     34w 5d                                        EDD:   08/31/19  Best:          35w 2d     Det. By:  LMP  (11/20/18)          EDD:   08/27/19 ---------------------------------------------------------------------- Anatomy  Cranium:               Appears normal         LVOT:                   Appears normal  Cavum:                 Previously seen        Aortic Arch:            Previously seen  Ventricles:            Previously seen        Ductal Arch:            Previously seen  Choroid Plexus:        Previously seen        Diaphragm:              Previously seen  Cerebellum:            Previously seen        Stomach:                Appears normal, left                                                                        sided  Posterior Fossa:       Previously seen        Abdomen:  Appears normal  Nuchal Fold:           Not applicable (>20    Abdominal Wall:         Previously seen                         wks GA)  Face:                  Orbits and profile     Cord Vessels:           Previously seen                          previously seen  Lips:                  Previously seen        Kidneys:                Appear normal  Palate:                Previously seen        Bladder:                Appears normal  Thoracic:              Appears normal         Spine:                  Previously seen  Heart:                 Appears normal         Upper Extremities:      Previously seen                         (4CH, axis, and                         situs)  RVOT:                  Appears normal         Lower Extremities:      Previously seen  Other:  Heels and 5th digit previously seen. Fetus appears to be a female. ---------------------------------------------------------------------- Cervix Uterus Adnexa  Cervix  Not visualized (advanced GA >24wks) ---------------------------------------------------------------------- Impression  Maternal hypothyroidism. Now euthyroid with levothyroxine  supplements.  Fetal growth is appropriate for gestational age. Amniotic fluid  is normal and good fetal activity is seen. Antenatal testing is  reassuring. BPP 8/8.  I reassured the patient of the findings. She reports good fetal  movements and does not have hypertension or diabetes.  Patient is taking nifedipine (tocolysis) prescribed by her  provider. She asked if she could discontinue her medication  now.  Nifedipine may provide some relief of maternal symptoms of  uterine contractions. It does not improve neonatal outcomes.  I informed her that she may discontinue the medication. ---------------------------------------------------------------------- Recommendations  -No follow-up appointments were made (euthyroid now). ----------------------------------------------------------------------                  Noralee Space, MD Electronically Signed Final Report   07/25/2019 04:06 pm ----------------------------------------------------------------------  [place] Mfm Ob Limited  Result Date:  07/20/2019 ----------------------------------------------------------------------  OBSTETRICS REPORT                       (  Signed Final 07/20/2019 01:18 pm) ---------------------------------------------------------------------- Patient Info  ID #:       [id number]                          D.O.B.:  1986/03/29 (33 yrs)  Name:       Paula French                   Visit Date: 07/20/2019 12:20 pm ---------------------------------------------------------------------- Performed By  Performed By:     Birdena Crandall        Secondary Phy.:    PEGGY                    RDMS,RVT                                                              CONSTANT MD  Attending:        Lin Landsman      Address:           Faculty                    MD  Referred By:      Lillia Pauls Phy.:     [facility]                                                              Care  Ref. Address:     [address]        [facility]                    [address]                                                              [facility] ---------------------------------------------------------------------- Orders   #  Description                          Code         Ordered By   1  [place] MFM OB LIMITED                    [id number]     PEGGY CONSTANT  ----------------------------------------------------------------------   #  Order #                    Accession #                 Episode #   1  [id number]                  [id number]  [id number]  ---------------------------------------------------------------------- Indications   Vaginal bleeding in pregnancy, third trimester O46.93   Abdominal pain in pregnancy                    O99.89   Hypothyroid (congenital)                       O99.280 E03.9   [redacted] weeks gestation of pregnancy                Z3A.34  ---------------------------------------------------------------------- Vital Signs                                                 Height:         5'2" ---------------------------------------------------------------------- Fetal Evaluation  Num Of Fetuses:          1  Fetal Heart Rate(bpm):   135  Cardiac Activity:        Observed  Presentation:            Cephalic  Placenta:                Anterior  P. Cord Insertion:       Visualized, central  Amniotic Fluid  AFI FV:      Within normal limits  AFI Sum(cm)     %Tile       Largest Pocket(cm)  18.68           69          6.48  RUQ(cm)       RLQ(cm)       LUQ(cm)        LLQ(cm)  4.72          6.48          3.83           3.65  Comment:    No placental abruption or previa identified. ---------------------------------------------------------------------- OB History  Gravidity:    4         Term:   3        Prem:   0        SAB:   0  TOP:          0       Ectopic:  0        Living: 3 ---------------------------------------------------------------------- Gestational Age  LMP:           34w 4d        Date:  11/20/18                 EDD:   08/27/19  Best:          34w 4d     Det. By:  LMP  (11/20/18)          EDD:   08/27/19 ---------------------------------------------------------------------- Anatomy  Ventricles:            Appears normal         Stomach:                Appears normal, left  sided  RVOT:                  Appears normal         Kidneys:                Appear normal  Aortic Arch:           Appears normal         Bladder:                Appears normal  Ductal Arch:           Appears normal ---------------------------------------------------------------------- Cervix Uterus Adnexa  Cervix  Normal appearance by transabdominal scan.  Uterus  No abnormality visualized. ---------------------------------------------------------------------- Impression  Limited exam  Vaginal bleeding  No evidence of placenta previa or abruption ---------------------------------------------------------------------- Recommendations  Follow up as clinically  indicated. ----------------------------------------------------------------------               Lin Landsmanorenthian Booker, MD Electronically Signed Final Report   07/20/2019 01:18 pm ----------------------------------------------------------------------   Assessment and Plan:  Pregnancy: [id number] at 3482w0d 1. Supervision of other normal pregnancy, antepartum  2. Congenital hypothyroidism - stable clinically  3. H/O hernia repair  4. Rh negative state in antepartum period - Rhogam postpartum   Term labor symptoms and general obstetric precautions including but not limited to vaginal bleeding, contractions, leaking of fluid and fetal movement were reviewed in detail with the patient. I discussed the assessment and treatment plan with the patient. The patient was provided an opportunity to ask questions and all were answered. The patient agreed with the plan and demonstrated an understanding of the instructions. The patient was advised to call back or seek an in-person office evaluation/[facility] for any urgent or concerning symptoms. Please refer to After Visit Summary for other counseling recommendations.   I provided 10 minutes of face-to-face time during this encounter.  Return in about 1 week (around 08/20/2019) for ROB in person.  Patient requests Suzette BattiestVeronica.Coral Ceo.   Atzin Buchta, MD [facility], [facility] 08/13/2019

## 2019-08-13 NOTE — Progress Notes (Signed)
Virtual ROB    CC: Pressure and back pain. Pt notes pressure with walking.

## 2019-08-15 IMAGING — US US MFM OB DETAIL +14 WK
1 series · 13 of 28 positions shown · non-contrast
Comparison: none

[Series 1: us mfm ob detail +14 wk · 13 of 70 slices shown]
[im 3/70]
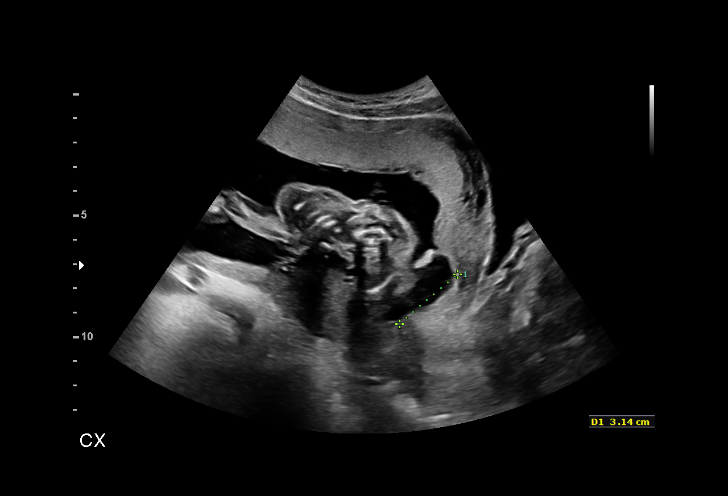
[im 8/70]
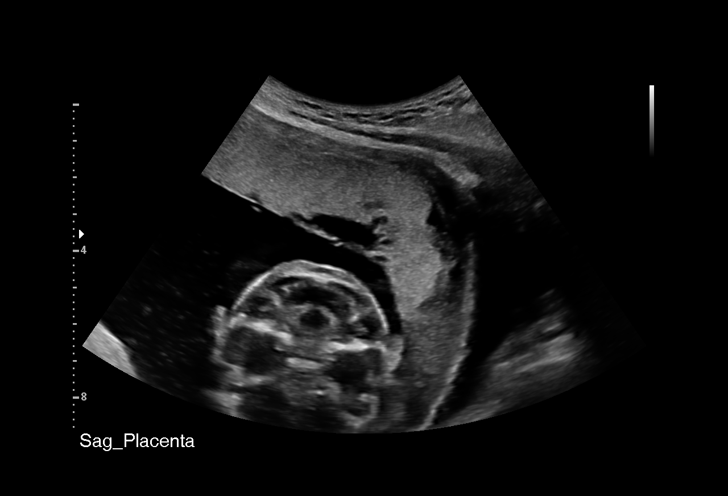
[im 13/70]
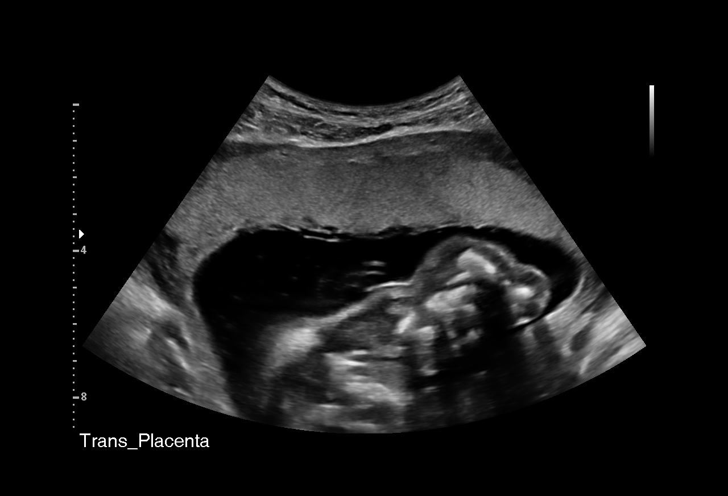
[im 18/70]
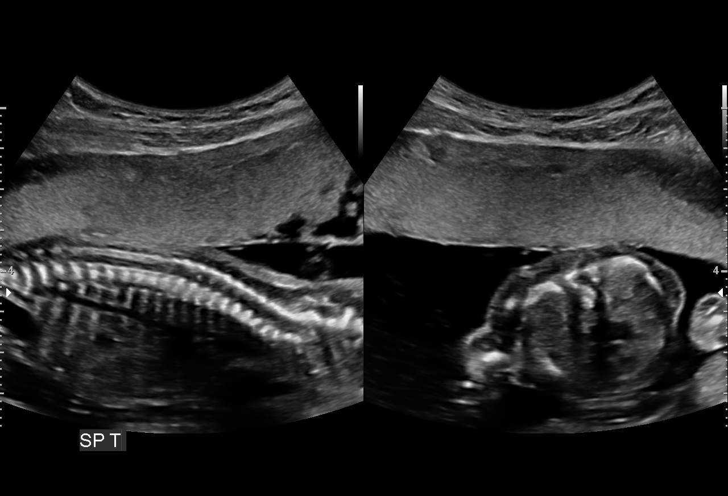
[im 24/70]
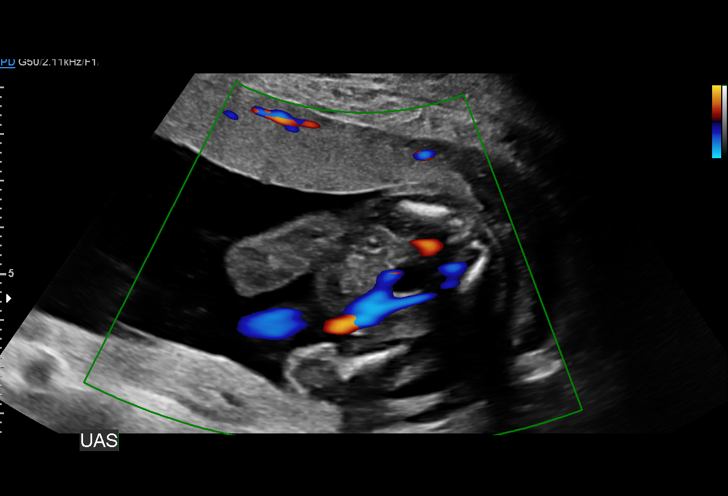
[im 29/70]
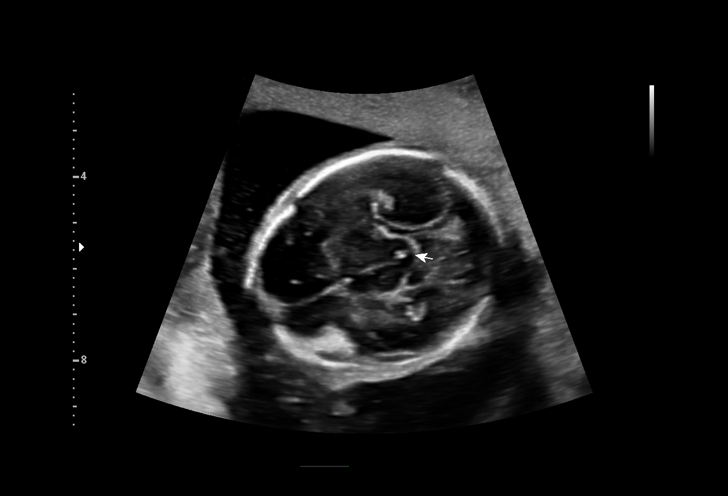
[im 36/70]
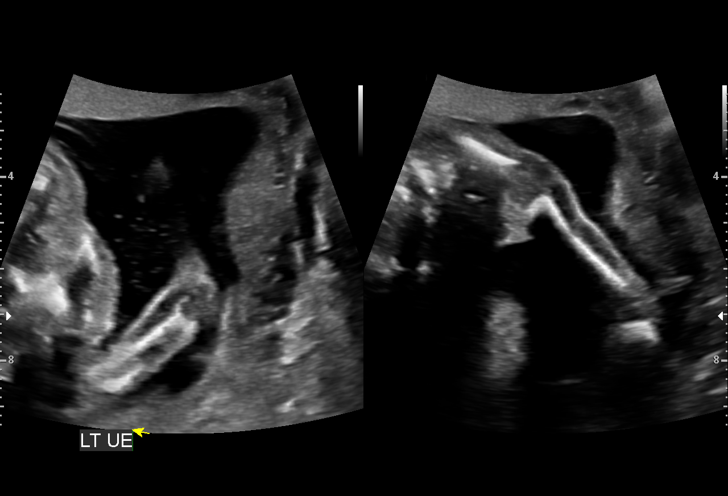
[im 41/70]
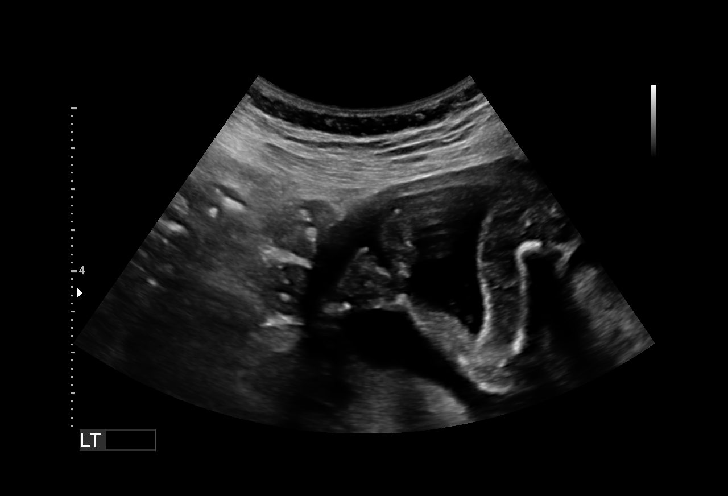
[im 47/70]
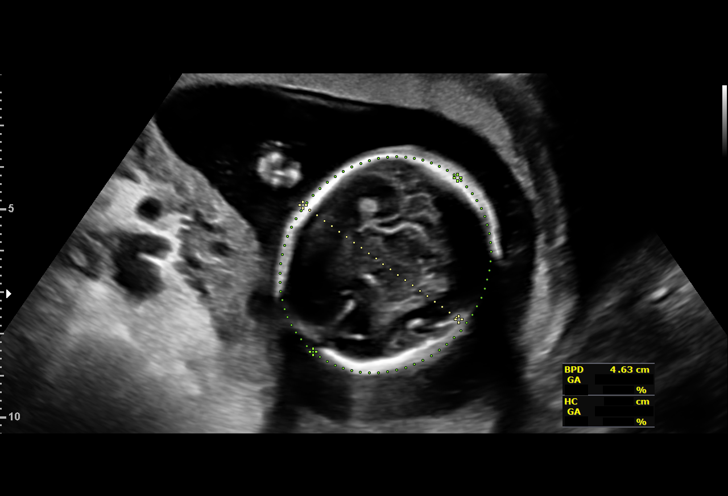
[im 52/70]
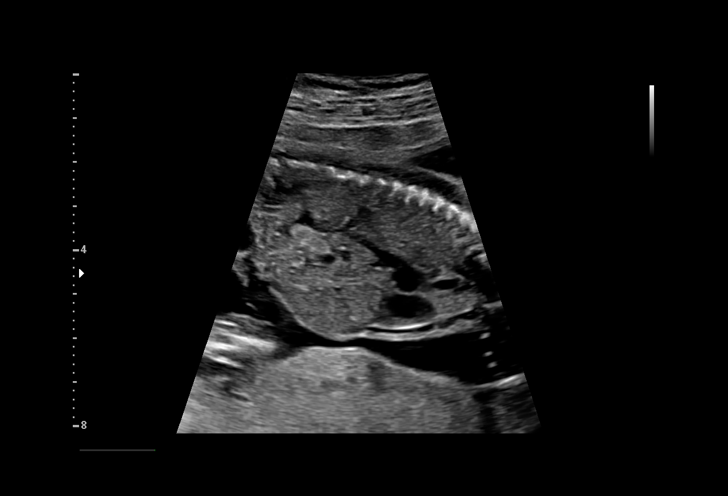
[im 57/70]
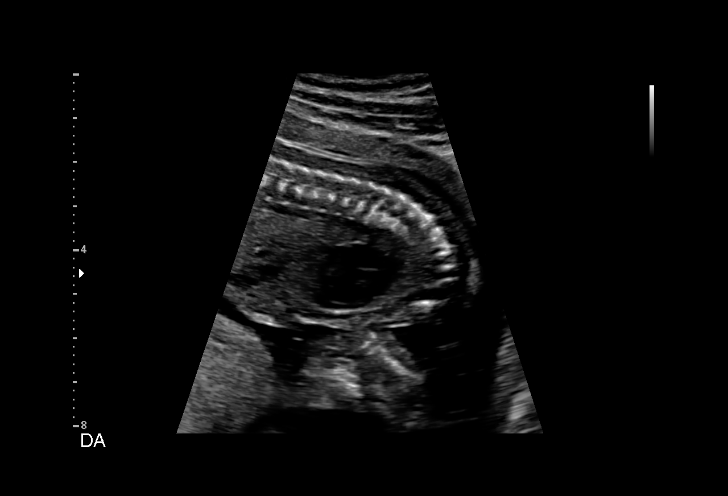
[im 62/70]
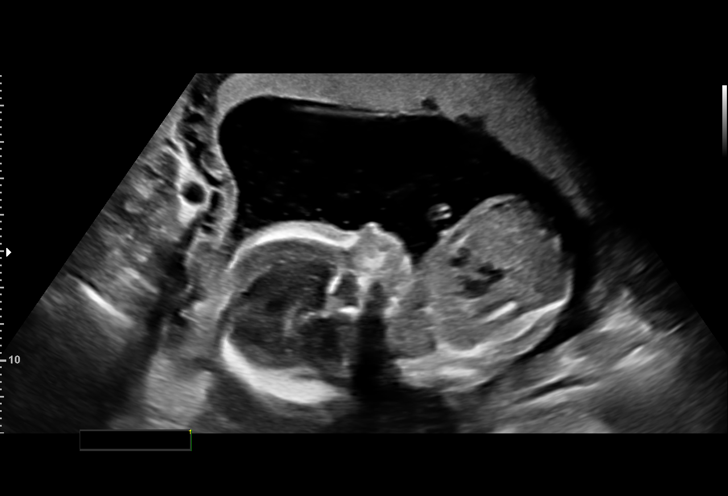
[im 67/70]
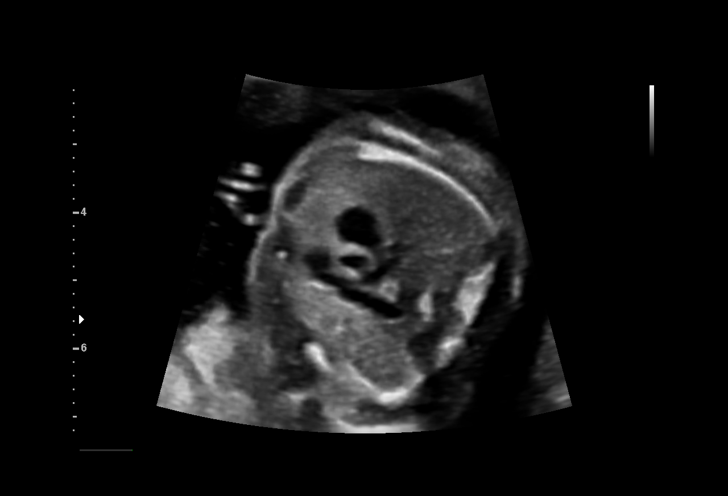

[13 of 28 positions shown; findings below may reference images not displayed]

[address]

 ----------------------------------------------------------------------

 ----------------------------------------------------------------------
Indications

  18 weeks gestation of pregnancy
  Hypothyroid
  Encounter for antenatal screening for
  malformations (low risk NIPS)
 ----------------------------------------------------------------------
Fetal Evaluation

 Num Of Fetuses:         1
 Fetal Heart Rate(bpm):  153
 Cardiac Activity:       Observed
 Presentation:           Cephalic
 Placenta:               Anterior
 P. Cord Insertion:      Visualized, central

 Amniotic Fluid
 AFI FV:      Within normal limits

                             Largest Pocket(cm)

Biometry

 BPD:      45.5  mm     G. Age:  19w 5d         85  %    CI:         79.4   %    70 - 86
                                                         FL/HC:      18.0   %    16.1 -
 HC:      161.4  mm     G. Age:  19w 0d         47  %    HC/AC:      1.17        1.09 -
 AC:      137.5  mm     G. Age:  19w 1d         57  %    FL/BPD:     63.7   %
 FL:         29  mm     G. Age:  18w 6d         46  %    FL/AC:      21.1   %    20 - 24
 HUM:      28.7  mm     G. Age:  19w 2d         63  %
 CER:      18.8  mm     G. Age:  18w 3d         36  %
 NFT:       2.8  mm

 CM:        5.8  mm
 Est. FW:     272  gm    0 lb 10 oz      49  %
OB History

 Gravidity:    4         Term:   3        Prem:   0        SAB:   0
 TOP:          0       Ectopic:  0        Living: 3
Gestational Age

 LMP:           18w 6d        Date:  11/20/18                 EDD:   08/27/19
 U/S Today:     19w 1d                                        EDD:   08/25/19
 Best:          18w 6d     Det. By:  LMP  (11/20/18)          EDD:   08/27/19
Anatomy

 Cranium:               Appears normal         Aortic Arch:            Appears normal
 Cavum:                 Appears normal         Ductal Arch:            Appears normal
 Ventricles:            Appears normal         Diaphragm:              Appears normal
 Choroid Plexus:        Appears normal         Stomach:                Appears normal, left
                                                                       sided
 Cerebellum:            Appears normal         Abdomen:                Appears normal
 Posterior Fossa:       Appears normal         Abdominal Wall:         Appears nml (cord
                                                                       insert, abd wall)
 Nuchal Fold:           Appears normal         Cord Vessels:           Appears normal (3
                                                                       vessel cord)
 Face:                  Appears normal         Kidneys:                Appear normal
                        (orbits and profile)
 Lips:                  Not well visualized    Bladder:                Appears normal
 Thoracic:              Appears normal         Spine:                  Appears normal
 Heart:                 Not well visualized    Upper Extremities:      Appears normal
 RVOT:                  Appears normal         Lower Extremities:      Appears normal
 LVOT:                  Not well visualized

 Other:  Fetus appears to be a male. Heels visualized.
Cervix Uterus Adnexa

 Cervix
 Length:              4  cm.
 Normal appearance by transabdominal scan.

 Uterus
 No abnormality visualized.

 Left Ovary
 No adnexal mass visualized.

 Right Ovary
 No adnexal mass visualized.

 Cul De Sac
 No free fluid seen.

 Adnexa
 No abnormality visualized.
Impression

 Normal interval growth.
 Suboptimal views of the fetal anatomy again seen today.
Recommendations

 Follow up anatomy in 4-6 weeks.

## 2019-08-22 ENCOUNTER — Ambulatory Visit (INDEPENDENT_AMBULATORY_CARE_PROVIDER_SITE_OTHER): Payer: BC Managed Care – PPO | Admitting: Internal Medicine

## 2019-08-22 ENCOUNTER — Other Ambulatory Visit: Payer: Self-pay

## 2019-08-22 VITALS — BP 113/75 | HR 93 | Wt 171.7 lb

## 2019-08-22 DIAGNOSIS — Z3480 Encounter for supervision of other normal pregnancy, unspecified trimester: Secondary | ICD-10-CM

## 2019-08-22 DIAGNOSIS — E031 Congenital hypothyroidism without goiter: Secondary | ICD-10-CM

## 2019-08-22 DIAGNOSIS — Z6791 Unspecified blood type, Rh negative: Secondary | ICD-10-CM

## 2019-08-22 DIAGNOSIS — O26893 Other specified pregnancy related conditions, third trimester: Secondary | ICD-10-CM

## 2019-08-22 DIAGNOSIS — O26899 Other specified pregnancy related conditions, unspecified trimester: Secondary | ICD-10-CM

## 2019-08-22 DIAGNOSIS — Z3A39 39 weeks gestation of pregnancy: Secondary | ICD-10-CM

## 2019-08-22 DIAGNOSIS — O360930 Maternal care for other rhesus isoimmunization, third trimester, not applicable or unspecified: Secondary | ICD-10-CM

## 2019-08-22 NOTE — Patient Instructions (Signed)
Call the office or go to [facility] if:  You begin to have strong, frequent contractions  Your water breaks.  Sometimes it is a big gush of fluid, sometimes it is just a trickle that keeps getting your panties wet or running down your legs  You have vaginal bleeding.  It is normal to have a small amount of spotting if your cervix was checked.   You don't feel your baby moving like normal.  If you don't, get you something to eat and drink and lay down and focus on feeling your baby move.  You should feel at least 10 movements in 2 hours.  If you don't, you should call the office or go to [facility].    Tdap Vaccine  It is recommended that you get the Tdap vaccine during the third trimester of EACH pregnancy to help protect your baby from getting pertussis (whooping cough)  27-36 weeks is the BEST time to do this so that you can pass the protection on to your baby. During pregnancy is better than after pregnancy, but if you are unable to get it during pregnancy it will be offered at the hospital.   You can get this vaccine with us, at the health department, your family doctor, or some local pharmacies  Everyone who will be around your baby should also be up-to-date on their vaccines before the baby comes. Adults (who are not pregnant) only need 1 dose of Tdap during adulthood.   Third Trimester of Pregnancy The third trimester is from week 29 through week 42, months 7 through 9. The third trimester is a time when the fetus is growing rapidly. At the end of the ninth month, the fetus is about 20 inches in length and weighs 6-10 pounds.  BODY CHANGES Your body goes through many changes during pregnancy. The changes vary from woman to woman.   Your weight will continue to increase. You can expect to gain 25-35 pounds (11-16 kg) by the end of the pregnancy.  You may begin to get stretch marks on your hips, abdomen, and breasts.  You may urinate more often because the fetus is  moving lower into your pelvis and pressing on your bladder.  You may develop or continue to have heartburn as a result of your pregnancy.  You may develop constipation because certain hormones are causing the muscles that push waste through your intestines to slow down.  You may develop hemorrhoids or swollen, bulging veins (varicose veins).  You may have pelvic pain because of the weight gain and pregnancy hormones relaxing your joints between the bones in your pelvis. Backaches may result from overexertion of the muscles supporting your posture.  You may have changes in your hair. These can include thickening of your hair, rapid growth, and changes in texture. Some women also have hair loss during or after pregnancy, or hair that feels dry or thin. Your hair will most likely return to normal after your baby is born.  Your breasts will continue to grow and be tender. A yellow discharge may leak from your breasts called colostrum.  Your belly button may stick out.  You may feel short of breath because of your expanding uterus.  You may notice the fetus "dropping," or moving lower in your abdomen.  You may have a bloody mucus discharge. This usually occurs a few days to a week before labor begins.  Your cervix becomes thin and soft (effaced) near your due date. WHAT TO EXPECT AT   YOUR PRENATAL EXAMS  You will have prenatal exams every 2 weeks until week 89. Then, you will have weekly prenatal exams. During a routine prenatal visit:  You will be weighed to make sure you and the fetus are growing normally.  Your blood pressure is taken.  Your abdomen will be measured to track your baby's growth.  The fetal heartbeat will be listened to.  Any test results from the previous visit will be discussed.  You may have a cervical check near your due date to see if you have effaced. At around 36 weeks, your caregiver will check your cervix. At the same time, your caregiver will also perform a  test on the secretions of the vaginal tissue. This test is to determine if a type of bacteria, Group B streptococcus, is present. Your caregiver will explain this further. Your caregiver may ask you:  What your birth plan is.  How you are feeling.  If you are feeling the baby move.  If you have had any abnormal symptoms, such as leaking fluid, bleeding, severe headaches, or abdominal cramping.  If you have any questions. Other tests or screenings that may be performed during your third trimester include:  Blood tests that check for low iron levels (anemia).  Fetal testing to check the health, activity level, and growth of the fetus. Testing is done if you have certain medical conditions or if there are problems during the pregnancy. FALSE LABOR You may feel small, irregular contractions that eventually go away. These are called Braxton Hicks contractions, or false labor. Contractions may last for hours, days, or even weeks before true labor sets in. If contractions come at regular intervals, intensify, or become painful, it is best to be seen by your caregiver.  SIGNS OF LABOR   Menstrual-like cramps.  Contractions that are 5 minutes apart or less.  Contractions that start on the top of the uterus and spread down to the lower abdomen and back.  A sense of increased pelvic pressure or back pain.  A watery or bloody mucus discharge that comes from the vagina. If you have any of these signs before the 37th week of pregnancy, call your caregiver right away. You need to go to the hospital to get checked immediately. HOME CARE INSTRUCTIONS   Avoid all smoking, herbs, alcohol, and unprescribed drugs. These chemicals affect the formation and growth of the baby.  Follow your caregiver's instructions regarding medicine use. There are medicines that are either safe or unsafe to take during pregnancy.  Exercise only as directed by your caregiver. Experiencing uterine cramps is a good sign to  stop exercising.  Continue to eat regular, healthy meals.  Wear a good support bra for breast tenderness.  Do not use hot tubs, steam rooms, or saunas.  Wear your seat belt at all times when driving.  Avoid raw meat, uncooked cheese, cat litter boxes, and soil used by cats. These carry germs that can cause birth defects in the baby.  Take your prenatal vitamins.  Try taking a stool softener (if your caregiver approves) if you develop constipation. Eat more high-fiber foods, such as fresh vegetables or fruit and whole grains. Drink plenty of fluids to keep your urine clear or pale yellow.  Take warm sitz baths to soothe any pain or discomfort caused by hemorrhoids. Use hemorrhoid cream if your caregiver approves.  If you develop varicose veins, wear support hose. Elevate your feet for 15 minutes, 3-4 times a day. Limit salt in your  diet.  Avoid heavy lifting, wear low heal shoes, and practice good posture.  Rest a lot with your legs elevated if you have leg cramps or low back pain.  Visit your dentist if you have not gone during your pregnancy. Use a soft toothbrush to brush your teeth and be gentle when you floss.  A sexual relationship may be continued unless your caregiver directs you otherwise.  Do not travel far distances unless it is absolutely necessary and only with the approval of your caregiver.  Take prenatal classes to understand, practice, and ask questions about the labor and delivery.  Make a trial run to the hospital.  Pack your hospital bag.  Prepare the baby's nursery.  Continue to go to all your prenatal visits as directed by your caregiver. SEEK MEDICAL CARE IF:  You are unsure if you are in labor or if your water has broken.  You have dizziness.  You have mild pelvic cramps, pelvic pressure, or nagging pain in your abdominal area.  You have persistent nausea, vomiting, or diarrhea.  You have a bad smelling vaginal discharge.  You have pain with  urination. SEEK IMMEDIATE MEDICAL CARE IF:   You have a fever.  You are leaking fluid from your vagina.  You have spotting or bleeding from your vagina.  You have severe abdominal cramping or pain.  You have rapid weight loss or gain.  You have shortness of breath with chest pain.  You notice sudden or extreme swelling of your face, hands, ankles, feet, or legs.  You have not felt your baby move in over an hour.  You have severe headaches that do not go away with medicine.  You have vision changes. Document Released: 11/14/2001 Document Revised: 11/25/2013 Document Reviewed: 01/21/2013 [facility] Patient Information 2015 [place], [place]. This information is not intended to replace advice given to you by your health care provider. Make sure you discuss any questions you have with your health care provider.

## 2019-08-22 NOTE — Progress Notes (Signed)
Pt is here for ROB. [redacted]w[redacted]d.

## 2019-08-22 NOTE — Progress Notes (Signed)
   PRENATAL VISIT NOTE  Subjective:  Paula French is a 33 y.o. [phone] at 103w2d being seen today for ongoing prenatal care.  She is currently monitored for the following issues for this high-risk pregnancy and has Congenital hypothyroidism; Rh negative state in antepartum period; Ovary absent; Supervision of other normal pregnancy, antepartum; Tobacco smoking affecting pregnancy; H/O hernia repair; Vaginal bleeding in pregnancy, third trimester; and Preterm labor in third trimester on their problem list.  Patient reports no complaints.  Contractions: Irritability. Vag. Bleeding: None.  Movement: Present. Denies leaking of fluid.   The following portions of the patient's history were reviewed and updated as appropriate: allergies, current medications, past family history, past medical history, past social history, past surgical history and problem list.   Objective:   Vitals:   08/22/19 0825  BP: 113/75  Pulse: 93  Weight: 77.9 kg    Fetal Status: Fetal Heart Rate (bpm): 145 Fundal Height: 38 cm Movement: Present     General:  Alert, oriented and cooperative. Patient is in no acute distress.  Skin: Skin is warm and dry. No rash noted.   Cardiovascular: Normal heart rate noted  Respiratory: Normal respiratory effort, no problems with respiration noted  Abdomen: Soft, gravid, appropriate for gestational age.  Pain/Pressure: Present     Pelvic: Cervical exam performed Dilation: 1.5 Effacement (%): Thick Station: -3  Extremities: Normal range of motion.  Edema: Trace  Mental Status: Normal mood and affect. Normal behavior. Normal judgment and thought content.   Assessment and Plan:  Pregnancy: G4P3003 at [redacted]w[redacted]d 1. Supervision of other normal pregnancy, antepartum Continue routine PNC. Considered stripping of membranes but patient very uncomfortable with cervical exam and cervix very posterior. Discussed that since she has had SOL at 39-40 weeks, that will likely occur again.  Return in 1  week for NST and visit. BPP ordered.  Plan for IOL at 41w for postdates if still pregnant.  Reviewed that GC/Chlamydia and GBS neg.  2. Rh negative state in antepartum period Will need evaluation for Rhogam PP.   3. Congenital hypothyroidism Asymptomatic and compliant with Synthroid. T3/T4 within range. TSH slightly low but improved from prior lab check.   Term labor symptoms and general obstetric precautions including but not limited to vaginal bleeding, contractions, leaking of fluid and fetal movement were reviewed in detail with the patient. Please refer to After Visit Summary for other counseling recommendations.   Return in about 1 week (around 08/29/2019) for routine [facility] (needs NST for postdates) .  No future appointments.  Melina Schools, DO

## 2019-08-25 ENCOUNTER — Encounter (HOSPITAL_COMMUNITY): Payer: Self-pay

## 2019-08-25 ENCOUNTER — Inpatient Hospital Stay (HOSPITAL_COMMUNITY)
Admission: AD | Admit: 2019-08-25 | Discharge: 2019-08-27 | DRG: 807 | Disposition: A | Discharge status: Home / Self Care | Payer: BC Managed Care – PPO | Attending: Obstetrics and Gynecology | Admitting: Obstetrics and Gynecology

## 2019-08-25 ENCOUNTER — Other Ambulatory Visit: Payer: Self-pay

## 2019-08-25 DIAGNOSIS — O26893 Other specified pregnancy related conditions, third trimester: Secondary | ICD-10-CM | POA: Diagnosis present

## 2019-08-25 DIAGNOSIS — Z20828 Contact with and (suspected) exposure to other viral communicable diseases: Secondary | ICD-10-CM | POA: Diagnosis present

## 2019-08-25 DIAGNOSIS — Z3A39 39 weeks gestation of pregnancy: Secondary | ICD-10-CM

## 2019-08-25 DIAGNOSIS — O26899 Other specified pregnancy related conditions, unspecified trimester: Secondary | ICD-10-CM

## 2019-08-25 DIAGNOSIS — Z8719 Personal history of other diseases of the digestive system: Secondary | ICD-10-CM

## 2019-08-25 DIAGNOSIS — Z6791 Unspecified blood type, Rh negative: Secondary | ICD-10-CM

## 2019-08-25 DIAGNOSIS — E031 Congenital hypothyroidism without goiter: Secondary | ICD-10-CM | POA: Diagnosis present

## 2019-08-25 DIAGNOSIS — O99284 Endocrine, nutritional and metabolic diseases complicating childbirth: Principal | ICD-10-CM | POA: Diagnosis present

## 2019-08-25 DIAGNOSIS — Z87891 Personal history of nicotine dependence: Secondary | ICD-10-CM

## 2019-08-25 NOTE — MAU Note (Signed)
Pt reports to MAU c/o ctx this evening. Pt reports they are every 5 min or less. +FM. No LOF. Pt reports some bloody show.

## 2019-08-26 ENCOUNTER — Encounter (HOSPITAL_COMMUNITY): Payer: Self-pay

## 2019-08-26 DIAGNOSIS — E031 Congenital hypothyroidism without goiter: Secondary | ICD-10-CM | POA: Diagnosis present

## 2019-08-26 DIAGNOSIS — O26893 Other specified pregnancy related conditions, third trimester: Secondary | ICD-10-CM | POA: Diagnosis present

## 2019-08-26 DIAGNOSIS — Z20828 Contact with and (suspected) exposure to other viral communicable diseases: Secondary | ICD-10-CM | POA: Diagnosis present

## 2019-08-26 DIAGNOSIS — Z3A39 39 weeks gestation of pregnancy: Secondary | ICD-10-CM | POA: Diagnosis not present

## 2019-08-26 DIAGNOSIS — Z87891 Personal history of nicotine dependence: Secondary | ICD-10-CM | POA: Diagnosis not present

## 2019-08-26 DIAGNOSIS — O99284 Endocrine, nutritional and metabolic diseases complicating childbirth: Secondary | ICD-10-CM | POA: Diagnosis present

## 2019-08-26 DIAGNOSIS — Z6791 Unspecified blood type, Rh negative: Secondary | ICD-10-CM | POA: Diagnosis not present

## 2019-08-26 LAB — SARS CORONAVIRUS 2 BY RT PCR (HOSPITAL ORDER, PERFORMED IN ~~LOC~~ HOSPITAL LAB): SARS Coronavirus 2: NEGATIVE

## 2019-08-26 LAB — CBC
HCT: 37.9 % (ref 36.0–46.0)
Hemoglobin: 13 g/dL (ref 12.0–15.0)
MCH: 30.7 pg (ref 26.0–34.0)
MCHC: 34.3 g/dL (ref 30.0–36.0)
MCV: 89.4 fL (ref 80.0–100.0)
Platelets: 183 10*3/uL (ref 150–400)
RBC: 4.24 MIL/uL (ref 3.87–5.11)
RDW: 13.3 % (ref 11.5–15.5)
WBC: 11.8 10*3/uL — ABNORMAL HIGH (ref 4.0–10.5)
nRBC: 0 % (ref 0.0–0.2)

## 2019-08-26 LAB — TYPE AND SCREEN
ABO/RH(D): O NEG
Antibody Screen: NEGATIVE

## 2019-08-26 LAB — RPR: RPR Ser Ql: NONREACTIVE

## 2019-08-26 MED ORDER — DIBUCAINE (PERIANAL) 1 % EX OINT: 1.0000 "application " | TOPICAL_OINTMENT | CUTANEOUS | Status: DC | PRN

## 2019-08-26 MED ORDER — MAGNESIUM HYDROXIDE 400 MG/5ML PO SUSP: 30.0000 mL | ORAL | Status: DC | PRN

## 2019-08-26 MED ORDER — FENTANYL CITRATE (PF) 100 MCG/2ML IJ SOLN: 50.0000 ug | INTRAMUSCULAR | Status: DC | PRN

## 2019-08-26 MED ORDER — PRENATAL MULTIVITAMIN CH
1.0000 | ORAL_TABLET | Freq: Every day | ORAL | Status: DC
  Administered 2019-08-26: 1 via ORAL
  Filled 2019-08-26: qty 1

## 2019-08-26 MED ORDER — ONDANSETRON HCL 4 MG/2ML IJ SOLN: 4.0000 mg | INTRAMUSCULAR | Status: DC | PRN

## 2019-08-26 MED ORDER — LACTATED RINGERS IV SOLN
INTRAVENOUS | Status: DC
  Administered 2019-08-26: 01:00:00 via INTRAVENOUS

## 2019-08-26 MED ORDER — WITCH HAZEL-GLYCERIN EX PADS: 1.0000 "application " | MEDICATED_PAD | CUTANEOUS | Status: DC | PRN

## 2019-08-26 MED ORDER — LEVOTHYROXINE SODIUM 75 MCG PO TABS
225.0000 ug | ORAL_TABLET | Freq: Every day | ORAL | Status: DC
  Administered 2019-08-26 – 2019-08-27 (×2): 225 ug via ORAL
  Filled 2019-08-26: qty 1
  Filled 2019-08-26 (×3): qty 3

## 2019-08-26 MED ORDER — IBUPROFEN 600 MG PO TABS
600.0000 mg | ORAL_TABLET | Freq: Four times a day (QID) | ORAL | Status: DC
  Administered 2019-08-26 – 2019-08-27 (×5): 600 mg via ORAL
  Filled 2019-08-26 (×4): qty 1

## 2019-08-26 MED ORDER — COCONUT OIL OIL: 1.0000 "application " | TOPICAL_OIL | Status: DC | PRN

## 2019-08-26 MED ORDER — SIMETHICONE 80 MG PO CHEW: 80.0000 mg | CHEWABLE_TABLET | ORAL | Status: DC | PRN

## 2019-08-26 MED ORDER — OXYTOCIN 40 UNITS IN NORMAL SALINE INFUSION - SIMPLE MED
2.5000 [IU]/h | INTRAVENOUS | Status: DC
  Administered 2019-08-26: 04:00:00 2.5 [IU]/h via INTRAVENOUS
  Filled 2019-08-26: qty 1000

## 2019-08-26 MED ORDER — SOD CITRATE-CITRIC ACID 500-334 MG/5ML PO SOLN
30.0000 mL | ORAL | Status: DC | PRN
  Administered 2019-08-26: 30 mL via ORAL
  Filled 2019-08-26: qty 30

## 2019-08-26 MED ORDER — DIPHENHYDRAMINE HCL 25 MG PO CAPS: 25.0000 mg | ORAL_CAPSULE | Freq: Four times a day (QID) | ORAL | Status: DC | PRN

## 2019-08-26 MED ORDER — BENZOCAINE-MENTHOL 20-0.5 % EX AERO: 1.0000 "application " | INHALATION_SPRAY | CUTANEOUS | Status: DC | PRN

## 2019-08-26 MED ORDER — ACETAMINOPHEN 325 MG PO TABS: 650.0000 mg | ORAL_TABLET | ORAL | Status: DC | PRN

## 2019-08-26 MED ORDER — OXYTOCIN BOLUS FROM INFUSION
500.0000 mL | Freq: Once | INTRAVENOUS | Status: AC
  Administered 2019-08-26: 500 mL via INTRAVENOUS

## 2019-08-26 MED ORDER — ONDANSETRON HCL 4 MG/2ML IJ SOLN: 4.0000 mg | Freq: Four times a day (QID) | INTRAMUSCULAR | Status: DC | PRN

## 2019-08-26 MED ORDER — LACTATED RINGERS IV SOLN: 500.0000 mL | INTRAVENOUS | Status: DC | PRN

## 2019-08-26 MED ORDER — ONDANSETRON HCL 4 MG PO TABS: 4.0000 mg | ORAL_TABLET | ORAL | Status: DC | PRN

## 2019-08-26 MED ORDER — LIDOCAINE HCL (PF) 1 % IJ SOLN
30.0000 mL | INTRAMUSCULAR | Status: DC | PRN
  Filled 2019-08-26: qty 30

## 2019-08-26 MED ORDER — SENNOSIDES-DOCUSATE SODIUM 8.6-50 MG PO TABS
2.0000 | ORAL_TABLET | ORAL | Status: DC
  Administered 2019-08-26: 23:00:00 2 via ORAL
  Filled 2019-08-26: qty 2

## 2019-08-26 NOTE — H&P (Signed)
LABOR AND DELIVERY ADMISSION HISTORY AND PHYSICAL NOTE  Paula French is a 33 y.o. female [phone] with IUP at [redacted]w[redacted]d by 43 wk [place] presenting for active labor.   She reports positive fetal movement. She denies leakage of fluid or vaginal bleeding.   She plans on breast feeding. She is planning on vasectomy of partner for birth control.  Prenatal History/Complications: PNC at [facility]:  @[redacted]w[redacted]d , CWD, normal anatomy, cephalic presentation, 13%YQM, EFW [id number] Pregnancy complications:  - Hypothyroidism - Rh negative, received rhogam 06/04/2019 - Preterm contractions w admission 8/15-8/17, given BMZ x2, d/c  Past Medical History: Past Medical History:  Diagnosis Date  . Congenital hypothyroidism   . PONV (postoperative nausea and vomiting)     Past Surgical History: Past Surgical History:  Procedure Laterality Date  . [place]  . INGUINAL HERNIA REPAIR  11/13/2018   RIGHT   . OOPHORECTOMY Right 09/2013   ovarian cysts    Obstetrical History: OB History    Gravida  4   Para  3   Term  3   Preterm      AB      Living  3     SAB      TAB      Ectopic      Multiple      Live Births  3           Social History: Social History   Socioeconomic History  . Marital status: Single    Spouse name: Not on file  . Number of children: Not on file  . Years of education: Not on file  . Highest education level: Not on file  Occupational History  . Not on file  Social Needs  . Financial resource strain: Not on file  . Food insecurity    Worry: Not on file    Inability: Not on file  . Transportation needs    Medical: Not on file    Non-medical: Not on file  Tobacco Use  . Smoking status: Former Smoker    Types: Cigarettes    Quit date: 02/17/2019    Years since quitting: 0.5  . Smokeless tobacco: Never Used  . Tobacco comment: 1 CIGARETTE DAY   Substance and Sexual Activity  . Alcohol use: No  . Drug use: No  . Sexual activity:  Yes    Birth control/protection: None  Lifestyle  . Physical activity    Days per week: Not on file    Minutes per session: Not on file  . Stress: Not on file  Relationships  . Social Herbalist on phone: Not on file    Gets together: Not on file    Attends religious service: Not on file    Active member of club or organization: Not on file    Attends meetings of clubs or organizations: Not on file    Relationship status: Not on file  Other Topics Concern  . Not on file  Social History Narrative  . Not on file    Family History: Family History  Problem Relation Age of Onset  . Breast cancer Mother   . Colon cancer Father   . Emphysema Maternal Grandfather   . Dementia Paternal Grandmother   . Thyroid disease Neg Hx     Allergies: Allergies  Allergen Reactions  . Dicyclomine Itching  . Penicillins Other (See Comments)    hallucinations  . Sulfa Antibiotics Other (See Comments)  hallucinations    Medications Prior to Admission  Medication Sig Dispense Refill Last Dose  . Elastic Bandages & Supports (COMFORT FIT MATERNITY SUPP SM) MISC 1 Units by Does not apply route daily as needed. (Patient not taking: Reported on 07/16/2019) 1 each 0   . levothyroxine (SYNTHROID) 75 MCG tablet Take 3 tablets (225 mcg total) by mouth daily before breakfast. 90 tablet 3   . NIFEdipine (ADALAT CC) 30 MG 24 hr tablet Take 1 tablet (30 mg total) by mouth 2 (two) times daily. (Patient not taking: Reported on 07/30/2019) 30 tablet 1   . Prenatal Vit w/Fe-Methylfol-FA (PNV PO) Take by mouth.        Review of Systems  All systems reviewed and negative except as stated in HPI  Physical Exam Blood pressure 116/78, pulse (!) 113, temperature 98.6 F (37 C), temperature source Oral, resp. rate 17, weight 79 kg, last menstrual period 11/20/2018, SpO2 98 %, currently breastfeeding. General appearance: alert, oriented, NAD Lungs: normal respiratory effort Heart: regular  rate Abdomen: soft, non-tender; gravid, leopolds 3400g Extremities: No calf swelling or tenderness Presentation: cephalic by RN exam Fetal monitoringBaseline: 125 bpm, Variability: Good {> 6 bpm), Accelerations: Reactive and Decelerations: Absent Uterine activityFrequency: Every 2-3 minutes  Dilation: 5.5 Effacement (%): 70 Station: -2 Exam by:: DCALLAWAY, RN  Prenatal labs: ABO, Rh: --/--/O NEG (08/15 2342) Antibody: POS (08/15 2342) Rubella: 7.03 (03/03 1628) RPR: Non Reactive (07/02 1059)  HBsAg: Negative (03/03 1628)  HIV: Non Reactive (07/02 1059)  GC/Chlamydia: neg/neg 8/26  GBS: Negative/-- (08/26 0422)  2-hr GTT: normal 06/05/2019 Genetic screening:  Low risk female Chief of Staff [place]: normal  Prenatal Transfer Tool  Maternal Diabetes: No Genetic Screening: Normal Maternal Ultrasounds/Referrals: Normal Fetal Ultrasounds or other Referrals:  None Maternal Substance Abuse:  Yes:  Type: Smoker Significant Maternal Medications:  Meds include: Other: Synthroid Significant Maternal Lab Results: Group B Strep negative and Rh negative  No results found for this or any previous visit (from the past 24 hour(s)).  Patient Active Problem List   Diagnosis Date Noted  . Normal labor 08/26/2019  . Preterm labor in third trimester 07/20/2019  . Vaginal bleeding in pregnancy, third trimester 07/19/2019  . H/O hernia repair 06/04/2019  . Supervision of other normal pregnancy, antepartum 02/04/2019  . Tobacco smoking affecting pregnancy 02/04/2019  . Congenital hypothyroidism 07/21/2014  . Rh negative state in antepartum period 07/21/2014  . Ovary absent 07/21/2014    Assessment: Paula French is a 33 y.o. G4P3003 at [redacted]w[redacted]d here for spontaneous labor.   #Labor: progressed from 1-->5.5 cm spontaneously in triage with good contraction pattern, expectant management.  #Pain: Declines epidural, IV pain meds PRN #FWB: Cat I strip #GBS/ID:  negative #COVID: swab pending #MOF:  Breast #MOC: Vasectomy for partner #Circ:  Yes (in house)  #Rh neg: Rhogam protocol PP  #Hypothyroidism: significantly elevated TSH 31 near beginning of pregnancy, subsequently controlled on Synthroid with acceptable TSH and normal T3/4 on 07/30/2019. Cont Synthroid daily.    Mary Sella [facility] 08/26/2019, 12:48 AM

## 2019-08-26 NOTE — Lactation Note (Signed)
This note was copied from a baby's chart. Lactation Consultation Note  Patient Name: Paula French [id number] Date: 08/26/2019 Reason for consult: Initial assessment;Term;Maternal endocrine disorder Type of Endocrine Disorder?: Thyroid(Congenital hypothyroidism)  LC in to visit with P4 Mom of term baby at 46 hrs old.  Baby has breastfed 3 times and is currently sleeping STS on Mom's chest.   Reassured Mom that sleeping was very normal at this time in baby's life.  Mom states that baby latches really well, no discomfort felt.  Encouraged continued STS, watching for cues and offering breast often.  Goal of >8 feedings per 24 hrs.  Lactation brochure left with Mom.  Mom aware of IP and OP lactation support available to her.  Maternal Data Formula Feeding for Exclusion: No Has patient been taught Hand Expression?: Yes Does the patient have breastfeeding experience prior to this delivery?: Yes  Feeding Feeding Type: Breast Fed  LATCH Score Latch: Grasps breast easily, tongue down, lips flanged, rhythmical sucking.  Audible Swallowing: A few with stimulation  Type of Nipple: Everted at rest and after stimulation  Comfort (Breast/Nipple): Soft / non-tender  Hold (Positioning): No assistance needed to correctly position infant at breast.  LATCH Score: 9  Interventions Interventions: Breast feeding basics reviewed;Skin to skin;Breast massage;Hand express  Lactation Tools Discussed/Used WIC Program: No   Consult Status Consult Status: Follow-up Date: 08/27/19 Follow-up type: In-patient    Broadus John 08/26/2019, 12:32 PM

## 2019-08-26 NOTE — Discharge Summary (Addendum)
Postpartum Discharge Summary     Patient Name: Paula French DOB: Aug 19, 1986 MRN: [id number]  Date of admission: 08/25/2019 Delivering Provider: Clarnce Flock   Date of discharge: 08/27/2019  Admitting diagnosis: CTX Intrauterine pregnancy: [redacted]w[redacted]d    Secondary diagnosis:  Active Problems:   Congenital hypothyroidism   Rh negative state in antepartum period   Normal labor  Additional problems: None     Discharge diagnosis: Term Pregnancy Delivered                                                                                                Post partum procedures:rhogam  Augmentation: None  Complications: None  Hospital course:  Onset of Labor With Vaginal Delivery     33y.o. yo [id number] admitted in Latent Labor on 08/25/2019. Patient had an uncomplicated labor course as follows: Arrived to MAU 1cm, over course of 4 hours progressed to 5cm. Admitted to L&D and quickly progressed to fully dilated spontaneously.  Membrane Rupture Time/Date: 1:35 AM ,08/26/2019   Intrapartum Procedures: Episiotomy: None [1]                                         Lacerations:  None [1]  Patient had a delivery of a Viable infant. 08/26/2019  Information for the patient's newborn:  HaStevana, Dufner0[[id number]]Delivery Method: Vaginal, Spontaneous(Filed from Delivery Summary)     [name] had an uncomplicated postpartum course.  She is ambulating, tolerating a regular diet, passing flatus, and urinating well. Patient is discharged home in stable condition on 08/27/19.  Delivery time: 3:19 AM    Magnesium Sulfate received: No BMZ received: No Rhophylac:Yes MMR:N/A Transfusion:No  Physical exam  Vitals:   08/26/19 1256 08/26/19 1706 08/26/19 2144 08/27/19 0516  BP: 112/84 123/79 121/74 113/82  Pulse: 62 63 77 62  Resp: 20 18 18 18   Temp: 98 F (36.7 C) 98.3 F (36.8 C) 97.8 F (36.6 C) 97.9 F (36.6 C)  TempSrc: Oral Oral Oral Axillary  SpO2:   97%   Weight:        General: alert, cooperative and no distress Lochia: appropriate Uterine Fundus: firm Incision: N/A DVT Evaluation: No evidence of DVT seen on physical exam. Negative Homan's sign. No cords or calf tenderness. No significant calf/ankle edema. Labs: Lab Results  Component Value Date   WBC 11.8 (H) 08/26/2019   HGB 13.0 08/26/2019   HCT 37.9 08/26/2019   MCV 89.4 08/26/2019   PLT 183 08/26/2019   CMP Latest Ref Rng & Units 11/22/2014  Glucose 70 - 99 mg/dL 90  BUN 6 - 23 mg/dL 13  Creatinine 0.50 - 1.10 mg/dL 0.51  Sodium 137 - 147 mEq/L 140  Potassium 3.7 - 5.3 mEq/L 3.8  Chloride 96 - 112 mEq/L 103  CO2 19 - 32 mEq/L 25  Calcium 8.4 - 10.5 mg/dL 9.3    Discharge instruction: per After Visit Summary and "Baby and Me Booklet".  After visit meds:  Allergies as of 08/27/2019      Reactions   Dicyclomine Itching   Penicillins Other (See Comments)   hallucinations   Sulfa Antibiotics Other (See Comments)   hallucinations      Medication List    TAKE these medications   Comfort Fit Maternity Supp Sm Misc 1 Units by Does not apply route daily as needed.   levothyroxine 75 MCG tablet Commonly known as: Synthroid Take 3 tablets (225 mcg total) by mouth daily before breakfast.   NIFEdipine 30 MG 24 hr tablet Commonly known as: ADALAT CC Take 1 tablet (30 mg total) by mouth 2 (two) times daily.   PNV PO Take by mouth.       Diet: routine diet  Activity: Advance as tolerated. Pelvic rest for 6 weeks.   Outpatient follow up:4 weeks Follow up Appt: Future Appointments  Date Time Provider [place]  09/26/2019  9:15 AM Luvenia Redden, PA-C [facility] None   Follow up Visit:   Please schedule this patient for Postpartum visit in: 4 weeks with the following provider: Any provider For C/S patients schedule nurse incision check in weeks 2 weeks: no Low risk pregnancy complicated by: hypothyroidism Delivery mode:  SVD Anticipated Birth Control:  Partner  vasectomy PP Procedures needed: recheck TSH  Schedule Integrated [place] visit: no   Newborn Data: Live born female  Birth Weight:   APGAR: 67, 9  Newborn Delivery   Birth date/time: 08/26/2019 03:19:00 Delivery type: Vaginal, Spontaneous      Baby Feeding: Breast Disposition:home with mother   08/27/2019 Merilyn Baba, DO

## 2019-08-27 MED ORDER — LEVOTHYROXINE SODIUM 150 MCG PO TABS: 150.0000 ug | ORAL_TABLET | Freq: Every day | ORAL | 1 refills | Status: DC

## 2019-08-27 NOTE — Discharge Instructions (Signed)

## 2019-08-27 NOTE — Lactation Note (Signed)
This note was copied from a baby's chart. Lactation Consultation Note  Patient Name: Paula French [id number] Date: 08/27/2019 Reason for consult: Follow-up assessment Baby is 32 hours old/4% weight loss.  Mom reports that baby has been cluster feeding.  Breasts are becoming full.  Discussed prevention and treatment of engorgement.  Mom has a manual pump at home.  Baby in crib showing feeding cues.  Reminded mom to feed with any cue.  Baby handed to mom and was noted to be jittery.  Observed mom latch baby with ease using cradle hold.  Good latch and swallows noted.  Report given to RN.  Encouraged to call for concerns prn.  Maternal Data    Feeding Feeding Type: Breast Fed  LATCH Score Latch: Grasps breast easily, tongue down, lips flanged, rhythmical sucking.  Audible Swallowing: Spontaneous and intermittent  Type of Nipple: Everted at rest and after stimulation  Comfort (Breast/Nipple): Soft / non-tender  Hold (Positioning): No assistance needed to correctly position infant at breast.  LATCH Score: 10  Interventions    Lactation Tools Discussed/Used     Consult Status Consult Status: Complete Follow-up type: Call as needed    Ave Filter 08/27/2019, 11:50 AM

## 2019-08-28 ENCOUNTER — Encounter: Payer: BC Managed Care – PPO | Admitting: Internal Medicine

## 2019-08-29 ENCOUNTER — Ambulatory Visit (HOSPITAL_COMMUNITY): Payer: BC Managed Care – PPO

## 2019-09-03 ENCOUNTER — Inpatient Hospital Stay (HOSPITAL_COMMUNITY): Payer: BC Managed Care – PPO

## 2019-09-03 ENCOUNTER — Inpatient Hospital Stay (HOSPITAL_COMMUNITY): Admission: AD | Admit: 2019-09-03 | Payer: BC Managed Care – PPO | Source: Home / Self Care

## 2019-09-26 ENCOUNTER — Encounter: Payer: Self-pay | Admitting: Medical

## 2019-09-26 ENCOUNTER — Other Ambulatory Visit: Payer: Self-pay

## 2019-09-26 ENCOUNTER — Ambulatory Visit (INDEPENDENT_AMBULATORY_CARE_PROVIDER_SITE_OTHER): Payer: BC Managed Care – PPO | Admitting: Medical

## 2019-09-26 VITALS — BP 110/74 | HR 89 | Wt 153.0 lb

## 2019-09-26 DIAGNOSIS — K5900 Constipation, unspecified: Secondary | ICD-10-CM

## 2019-09-26 DIAGNOSIS — E031 Congenital hypothyroidism without goiter: Secondary | ICD-10-CM

## 2019-09-26 MED ORDER — SENNOSIDES-DOCUSATE SODIUM 8.6-50 MG PO TABS: 1.0000 | ORAL_TABLET | Freq: Every day | ORAL | 0 refills | Status: DC

## 2019-09-26 NOTE — Progress Notes (Signed)
Post Partum Exam  Paula French is a 33 y.o. [phone] female who presents for a postpartum visit. She is 4 weeks postpartum following a spontaneous vaginal delivery. I have fully reviewed the prenatal and intrapartum course. The delivery was at 39.6 gestational weeks.  Anesthesia: none. Postpartum course has been good. Baby's course has been good. He had a lip and tongue tie revision this week. Baby is feeding by breast. Bleeding staining only. Bowel function is constipated. Bladder function is normal. Patient is not sexually active. Contraception method is vasectomy. Postpartum depression screening:neg  The following portions of the patient's history were reviewed and updated as appropriate: allergies, current medications, past family history, past medical history, past social history, past surgical history and problem list. Last pap smear done summer 2019 with PCP and was Normal  Review of Systems Pertinent items are noted in HPI.    Objective:  Blood pressure 110/74, pulse 89, weight 153 lb (69.4 kg), currently breastfeeding.  General:  alert and cooperative   Breasts:  not performed  Lungs: clear to auscultation bilaterally  Heart:  regular rate and rhythm, S1, S2 normal, no murmur, click, rub or gallop  Abdomen: soft, non-tender; bowel sounds normal; no masses,  no organomegaly   Vulva:  not evaluated  Vagina: not evaluated  Cervix:  not evaluated  Corpus: not examined  Adnexa:  not evaluated  Rectal Exam: Not performed.        Assessment:    Normal postpartum exam. Pap smear not done at today's visit.  Congenital hypothyroidism   Plan:   1. Contraception: vasectomy scheduled for 11/20 2. TSH, T3, T4 today, will contact patient if adjustments are needed to TSH. Had been on 250 mcg at end of pregnancy and then 200 mcg PP until today started on 150 mcg.  3. Follow up in: 1 year for annual exam or sooner as needed. Encouraged to follow-up with endocrinology in ~ 6 months or sooner  depending on lab results   Danielle Rankin 09/26/2019 10:04 AM

## 2019-09-26 NOTE — Patient Instructions (Signed)

## 2019-09-27 LAB — TSH: TSH: 0.633 u[IU]/mL (ref 0.450–4.500)

## 2019-10-22 LAB — T4: T4, Total: 6.1 ug/dL (ref 4.5–12.0)

## 2019-10-22 LAB — T3: T3, Total: 101 ng/dL (ref 71–180)

## 2019-10-22 LAB — SPECIMEN STATUS REPORT

## 2020-01-08 ENCOUNTER — Other Ambulatory Visit (INDEPENDENT_AMBULATORY_CARE_PROVIDER_SITE_OTHER): Payer: Medicaid Other

## 2020-01-08 ENCOUNTER — Other Ambulatory Visit: Payer: Self-pay

## 2020-01-08 ENCOUNTER — Other Ambulatory Visit: Payer: Self-pay | Admitting: Endocrinology

## 2020-01-08 DIAGNOSIS — E031 Congenital hypothyroidism without goiter: Secondary | ICD-10-CM

## 2020-01-08 LAB — TSH: TSH: 3.39 u[IU]/mL (ref 0.35–4.50)

## 2020-01-08 LAB — T4, FREE: Free T4: 0.98 ng/dL (ref 0.60–1.60)

## 2020-01-12 NOTE — Progress Notes (Signed)
Patient ID: Paula French, female   DOB: Nov 23, 1986, 34 y.o.   MRN: [id number]   Reason for Appointment:  Hypothyroidism, follow-up visit  I connected with the above-named patient by video enabled telemedicine application and verified that I am speaking with the correct person. The patient was explained the limitations of evaluation and management by telemedicine and the availability of in person appointments.  Patient also understood that there may be a patient responsible charge related to this service . Location of the patient: Patient's home . Location of the provider: Physician office Only the patient and myself were participating in the encounter The patient understood the above statements and agreed to proceed.   History of Present Illness:    Background history:  Her hypothyroidism  was first diagnosed  at birth She was diagnosed to have congenital hypothyroidism Previously in her youth she was followed by an endocrinologist but in the last few years had been seen by primary care physicians at the local urgent care Center When her thyroid levels are not optimal as she does have symptoms of significant fatigue, moodiness, hair loss and weight gain  She has mostly been taking 150 mcg of Synthroid when she is not pregnant She usually needs a higher dose during pregnancies, generally 200 g  She thinks that she feels better when she takes brand name Synthroid compared to levothyroxine but has been concerned about the cost  RECENT history:  She had her fourth pregnancy last year and did not come for follow-up Her gynecologist increased her dose up to 225 mcg However no labs are available After pregnancy patient has been herself gradually reducing her medication without any guidance or labs  She says for the last 2 months she has been on 150 mcg Synthroid daily On her last visit she had been told to take 6-1/2 pills a week  Has again been very regular with taking her  Synthroid in the morning before breakfast; takes her multivitamin at least a couple of hours later She says that she chews the tablet and does not eat at that time  She does feel fatigued but this is from staying up at night with her baby No cold intolerance She has had more hair loss in the last few weeks  She is asking about lactose present in the Synthroid and she thinks she wants to avoid this because her baby is having some allergies and she is breast-feeding   Wt Readings from Last 3 Encounters:  09/26/19 153 lb (69.4 kg)  08/25/19 174 lb 3.2 oz (79 kg)  08/22/19 171 lb 11.2 oz (77.9 kg)   Her thyroid levels now show that TSH is normal at 3.39, previously was 2.9 prior to adjustment    Lab Results  Component Value Date   TSH 3.39 01/08/2020   TSH 0.633 09/26/2019   TSH 0.188 (L) 07/30/2019   FREET4 0.98 01/08/2020   FREET4 1.48 07/30/2019   FREET4 1.22 05/15/2018      Past Medical History:  Diagnosis Date  . Congenital hypothyroidism   . PONV (postoperative nausea and vomiting)     Past Surgical History:  Procedure Laterality Date  . ABDOMINAL HERNIA REPAIR  1993, 1997  . INGUINAL HERNIA REPAIR  11/13/2018   RIGHT   . OOPHORECTOMY Right 09/2013   ovarian cysts    Family History  Problem Relation Age of Onset  . Breast cancer Mother   . Colon cancer Father   . Emphysema Maternal Grandfather   .  Dementia Paternal Grandmother   . Thyroid disease Neg Hx     Social History:  reports that she quit smoking about 10 months ago. Her smoking use included cigarettes. She has never used smokeless tobacco. She reports that she does not drink alcohol or use drugs.  Allergies:  Allergies  Allergen Reactions  . Dicyclomine Itching  . Penicillins Other (See Comments)    hallucinations  . Sulfa Antibiotics Other (See Comments)    hallucinations    Allergies as of 01/13/2020      Reactions   Dicyclomine Itching   Penicillins Other (See Comments)    hallucinations   Sulfa Antibiotics Other (See Comments)   hallucinations      Medication List       Accurate as of January 13, 2020  8:13 AM. If you have any questions, ask your nurse or doctor.        alum hydroxide-mag trisilicate 18-56 MG Chew chewable tablet Commonly known as: GAVISCON Chew 3 tablets by mouth 4 (four) times daily as needed for indigestion or heartburn.   Comfort Fit Maternity Supp Sm Misc 1 Units by Does not apply route daily as needed.   levothyroxine 150 MCG tablet Commonly known as: Synthroid Take 1 tablet (150 mcg total) by mouth daily.   NIFEdipine 30 MG 24 hr tablet Commonly known as: ADALAT CC Take 1 tablet (30 mg total) by mouth 2 (two) times daily.   PNV PO Take by mouth.   senna-docusate 8.6-50 MG tablet Commonly known as: Senokot-S Take 1 tablet by mouth daily.       Review of Systems:  She takes prenatal vitamins Not taking any PPI drugs   Examination:    There were no vitals taken for this visit.   Assessment   Congenital hypothyroidism, with atrophic thyroid  She has been on 150 mcg dose recently and prior to her pregnancy As before taking brand-name Synthroid Difficult to assess her symptoms because she is taking care of her small baby at home She does have a hair loss and not clear how to explain this since this is unusual However she has not had any monitoring of her thyroid levels over the last few months and has adjusted her thyroid dose on her own  Since her TSH is quite normal at 3.3 she likely is not symptomatic from this   Plan:  She will continue 150 mcg brand name Synthroid Discussed that since she is breast-feeding and her milk contains lactose she did not have to change Synthroid because of the lactose filler that she believes it has  Follow-up in 4 months     Geo Slone 01/13/2020, 8:13 AM

## 2020-01-13 ENCOUNTER — Encounter: Payer: Self-pay | Admitting: Endocrinology

## 2020-01-13 ENCOUNTER — Ambulatory Visit (INDEPENDENT_AMBULATORY_CARE_PROVIDER_SITE_OTHER): Payer: Medicaid Other | Admitting: Endocrinology

## 2020-01-13 ENCOUNTER — Other Ambulatory Visit: Payer: Self-pay

## 2020-01-13 DIAGNOSIS — E031 Congenital hypothyroidism without goiter: Secondary | ICD-10-CM

## 2020-02-09 ENCOUNTER — Telehealth: Payer: Self-pay

## 2020-02-09 NOTE — Telephone Encounter (Signed)
Called Paula French regarding request that was received from Paula French's pharmacy to change from levothyroxine to name brand levoxyl because her son, who she is breastfeeding, is having issues related to lactose intolerance. Paula French stated that she is trying to remove all things from her diet that may have lactose in them, and Synthroid and generic medications like it are not certified lactose free, but Levoxyl is. Informed Paula French that Dr. Lucianne Muss stated that it was not medically necessary and if there happened to be any lactose in these medications, it would be so minute that it would have no effect. Dr. Lucianne Muss also stated that he informed the Paula French that her infant would still be receiving lactose through breast milk because the Paula French makes it naturally in breast milk. This was reinforced to Paula French and explained that even if she cuts out all lactose in her diet, her body continues to make it and regardless of what she consumes, breast milk will still contain lactose.  Paula French became upset and voiced her frustrations that she is attempting to avoid changing her son to formula because "some doctor doesn't find it necessary." Paula French then stated that she may have to explore the option of finding a new doctor that would help her eliminate this problem.

## 2020-02-13 ENCOUNTER — Telehealth: Payer: Self-pay | Admitting: Endocrinology

## 2020-02-13 ENCOUNTER — Other Ambulatory Visit: Payer: Self-pay

## 2020-02-13 MED ORDER — LEVOXYL 150 MCG PO TABS: 150.0000 ug | ORAL_TABLET | Freq: Every day | ORAL | 2 refills | Status: DC

## 2020-02-13 NOTE — Telephone Encounter (Signed)
Called pharmacy and informed them that last week, Dr. Lucianne Muss had denied this request.

## 2020-02-13 NOTE — Telephone Encounter (Signed)
Called pharmacy back and notified them of this, and sent an Rx for this medication. Pharmacist stated that she would note that no PA will be attempted if required, and she would notify pt.

## 2020-02-13 NOTE — Telephone Encounter (Signed)
Chelsea from [facility] pharmacy is calling to confirm a switch to Name Brand Levoxyl per this patients request.  Please contact them at [phone]

## 2020-02-13 NOTE — Telephone Encounter (Signed)
She can have Levoxyl as long as we do not have to do a prior authorization

## 2020-02-28 ENCOUNTER — Ambulatory Visit: Payer: Medicaid Other | Attending: Internal Medicine

## 2020-02-28 DIAGNOSIS — Z23 Encounter for immunization: Secondary | ICD-10-CM

## 2020-02-28 NOTE — Progress Notes (Signed)
   Covid-19 Vaccination Clinic  Name:  Avery Eustice    MRN: [id number] DOB: March 01, 1986  02/28/2020  Ms. Mcfate was observed post Covid-19 immunization for 30 minutes based on pre-vaccination screening without incident. She was provided with Vaccine Information Sheet and instruction to access the V-Safe system.   Ms. Hadlock was instructed to call 911 with any severe reactions post vaccine: Marland Kitchen Difficulty breathing  . Swelling of face and throat  . A fast heartbeat  . A bad rash all over body  . Dizziness and weakness   Immunizations Administered    Name Date Dose VIS Date Route   Pfizer COVID-19 Vaccine 02/28/2020  2:59 PM 0.3 mL 11/14/2019 Intramuscular   Manufacturer: [employer], [employer]   Lot: [id number]   NDC: [address]-3

## 2020-03-24 ENCOUNTER — Ambulatory Visit: Payer: Medicaid Other | Attending: Internal Medicine

## 2020-03-24 DIAGNOSIS — Z23 Encounter for immunization: Secondary | ICD-10-CM

## 2020-03-24 NOTE — Progress Notes (Signed)
   Covid-19 Vaccination Clinic  Name:  Paula French    MRN: [id number] DOB: 1986-02-12  03/24/2020  Paula French was observed post Covid-19 immunization for 15 minutes without incident. She was provided with Vaccine Information Sheet and instruction to access the V-Safe system.   Paula French was instructed to call 911 with any severe reactions post vaccine: Marland Kitchen Difficulty breathing  . Swelling of face and throat  . A fast heartbeat  . A bad rash all over body  . Dizziness and weakness   Immunizations Administered    Name Date Dose VIS Date Route   Pfizer COVID-19 Vaccine 03/24/2020  9:04 AM 0.3 mL 01/28/2019 Intramuscular   Manufacturer: [employer], [employer]   Lot: [id number]   NDC: [address]-0

## 2020-05-11 ENCOUNTER — Other Ambulatory Visit: Payer: Self-pay

## 2020-05-11 MED ORDER — LEVOXYL 150 MCG PO TABS: ORAL_TABLET | ORAL | 0 refills | Status: DC

## 2020-06-11 ENCOUNTER — Other Ambulatory Visit: Payer: Self-pay

## 2020-06-11 MED ORDER — LEVOXYL 150 MCG PO TABS: ORAL_TABLET | ORAL | 0 refills | Status: DC

## 2022-08-22 ENCOUNTER — Other Ambulatory Visit: Payer: Self-pay | Admitting: Obstetrics & Gynecology

## 2022-08-22 DIAGNOSIS — N644 Mastodynia: Secondary | ICD-10-CM

## 2022-09-01 ENCOUNTER — Ambulatory Visit
Admission: RE | Admit: 2022-09-01 | Discharge: 2022-09-01 | Disposition: A | Discharge status: Home / Self Care | Payer: Medicaid Other | Source: Ambulatory Visit | Attending: Obstetrics & Gynecology | Admitting: Obstetrics & Gynecology

## 2022-09-01 DIAGNOSIS — N644 Mastodynia: Secondary | ICD-10-CM

## 2022-09-06 ENCOUNTER — Other Ambulatory Visit: Payer: Self-pay | Admitting: Obstetrics & Gynecology

## 2022-09-06 DIAGNOSIS — Z803 Family history of malignant neoplasm of breast: Secondary | ICD-10-CM

## 2022-09-25 ENCOUNTER — Ambulatory Visit
Admission: RE | Admit: 2022-09-25 | Discharge: 2022-09-25 | Disposition: A | Discharge status: Home / Self Care | Payer: Medicaid Other | Source: Ambulatory Visit | Attending: Obstetrics & Gynecology | Admitting: Obstetrics & Gynecology

## 2022-09-25 DIAGNOSIS — Z803 Family history of malignant neoplasm of breast: Secondary | ICD-10-CM

## 2022-09-25 MED ORDER — GADOPICLENOL 0.5 MMOL/ML IV SOLN
6.0000 mL | Freq: Once | INTRAVENOUS | Status: AC | PRN
  Administered 2022-09-25: 6 mL via INTRAVENOUS

## 2023-08-08 ENCOUNTER — Telehealth: Payer: Self-pay | Admitting: Psychiatry

## 2023-08-08 ENCOUNTER — Ambulatory Visit (INDEPENDENT_AMBULATORY_CARE_PROVIDER_SITE_OTHER): Payer: No Typology Code available for payment source | Admitting: Psychiatry

## 2023-08-08 ENCOUNTER — Encounter: Payer: Self-pay | Admitting: Psychiatry

## 2023-08-08 VITALS — BP 122/72 | HR 78 | Ht 63.0 in | Wt 125.8 lb

## 2023-08-08 DIAGNOSIS — G44329 Chronic post-traumatic headache, not intractable: Secondary | ICD-10-CM

## 2023-08-08 DIAGNOSIS — G9600 Cerebrospinal fluid leak, unspecified: Secondary | ICD-10-CM | POA: Diagnosis not present

## 2023-08-08 DIAGNOSIS — G500 Trigeminal neuralgia: Secondary | ICD-10-CM | POA: Diagnosis not present
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
## 2023-08-08 NOTE — Patient Instructions (Signed)
Natural supplements that can reduce migraines: Magnesium Oxide or Magnesium Glycinate 500 mg at bed Coenzyme Q10 300 mg in AM Vitamin B2- 200 mg twice a day  Add 1 supplement at a time since even natural supplements can have undesirable side effects. You can sometimes buy supplements cheaper (especially Coenzyme Q10) at www.puritan.com or at [employer].  Magnesium: Magnesium (250 mg twice a day or 500 mg at bed) has a relaxant effect on smooth muscles such as blood vessels. Individuals suffering from frequent or daily headache usually have low magnesium levels which can be increase with daily supplementation of 400-800 mg. Three trials found 40-90% average headache reduction  when used as a preventative. Magnesium also demonstrated the benefit in menstrually related migraine.  Magnesium is part of the messenger system in the serotonin cascade and it is a good muscle relaxant.  It is also useful for constipation. Good sources include nuts, whole grains, and tomatoes. Side Effects: loose stool/diarrhea  Riboflavin (vitamin B 2) 200 mg twice a day. This vitamin assists nerve cells in the production of ATP a principal energy storing molecule.  It is necessary for many chemical reactions in the body.  There have been at least 3 clinical trials of riboflavin using 400 mg per day all of which suggested that migraine frequency can be decreased.  All 3 trials showed significant improvement in over half of migraine sufferers.  The supplement is found in bread, cereal, milk, meat, and poultry.  Most Americans get more riboflavin than the recommended daily allowance, however riboflavin deficiency is not necessary for the supplements to help prevent headache. Side effects: energizing, green urine  Coenzyme Q10: This is present in almost all cells in the body and is critical component for the conversion of energy.  Recent studies have shown that a nutritional supplement of CoQ10 can reduce the frequency of migraine attacks  by improving the energy production of cells as with riboflavin.  Doses of 150 mg twice a day or 300 mg in the morning have been shown to be effective.   

## 2023-08-08 NOTE — Telephone Encounter (Signed)
sent to GI they obtain Drucie Opitz, [facility] when second. [phone]

## 2023-08-09 ENCOUNTER — Encounter: Payer: Self-pay | Admitting: Psychiatry

## 2023-08-10 ENCOUNTER — Encounter: Payer: Self-pay | Admitting: Psychiatry

## 2023-08-16 ENCOUNTER — Ambulatory Visit
Admission: RE | Admit: 2023-08-16 | Discharge: 2023-08-16 | Disposition: A | Discharge status: Home / Self Care | Payer: No Typology Code available for payment source | Source: Ambulatory Visit | Attending: Psychiatry | Admitting: Psychiatry

## 2023-08-16 DIAGNOSIS — G9600 Cerebrospinal fluid leak, unspecified: Secondary | ICD-10-CM

## 2023-08-16 DIAGNOSIS — G500 Trigeminal neuralgia: Secondary | ICD-10-CM

## 2023-08-16 MED ORDER — GADOPICLENOL 0.5 MMOL/ML IV SOLN
6.0000 mL | Freq: Once | INTRAVENOUS | Status: AC | PRN
  Administered 2023-08-16: 6 mL via INTRAVENOUS

## 2023-08-17 ENCOUNTER — Encounter: Payer: Self-pay | Admitting: Psychiatry

## 2023-08-17 DIAGNOSIS — R519 Headache, unspecified: Secondary | ICD-10-CM

## 2023-08-17 DIAGNOSIS — J342 Deviated nasal septum: Secondary | ICD-10-CM

## 2023-08-20 ENCOUNTER — Other Ambulatory Visit: Payer: No Typology Code available for payment source

## 2023-08-22 MED ORDER — TOPIRAMATE 25 MG PO TABS: 25.0000 mg | ORAL_TABLET | Freq: Every day | ORAL | 6 refills | Status: DC

## 2023-09-06 ENCOUNTER — Telehealth: Payer: Self-pay | Admitting: Psychiatry

## 2023-09-06 NOTE — Telephone Encounter (Addendum)
Referral for ENT fax to [facility]. Phone: [phone], Fax: [phone]  Notified the pt where referral has been sent.  Pt stated,  that is fine; have already been there, may be this will get me seen sooner

## 2023-09-12 NOTE — Telephone Encounter (Signed)
Pt has called to report that [facility] has informed her they will not be able to take her on and that she would need to call [facility] back to be referred to another office

## 2023-09-17 NOTE — Telephone Encounter (Signed)
Noted  

## 2023-09-17 NOTE — Telephone Encounter (Signed)
Discuss with patient where would like referral resent to. Pt said,  was told by [facility] to have referral sent to [facility]. Refaxed referral to [facility]ist. Phone: [phone], Fax: [phone]

## 2023-09-18 ENCOUNTER — Other Ambulatory Visit: Payer: Self-pay

## 2023-09-18 DIAGNOSIS — R519 Headache, unspecified: Secondary | ICD-10-CM

## 2023-09-18 DIAGNOSIS — J342 Deviated nasal septum: Secondary | ICD-10-CM

## 2023-10-01 ENCOUNTER — Encounter (INDEPENDENT_AMBULATORY_CARE_PROVIDER_SITE_OTHER): Payer: Self-pay

## 2023-10-01 ENCOUNTER — Ambulatory Visit (INDEPENDENT_AMBULATORY_CARE_PROVIDER_SITE_OTHER): Payer: No Typology Code available for payment source

## 2023-10-01 VITALS — Ht 63.0 in | Wt 120.0 lb

## 2023-10-01 DIAGNOSIS — J342 Deviated nasal septum: Secondary | ICD-10-CM | POA: Diagnosis not present

## 2023-10-01 DIAGNOSIS — J310 Chronic rhinitis: Secondary | ICD-10-CM

## 2023-10-01 DIAGNOSIS — J3489 Other specified disorders of nose and nasal sinuses: Secondary | ICD-10-CM | POA: Diagnosis not present

## 2023-10-01 DIAGNOSIS — J343 Hypertrophy of nasal turbinates: Secondary | ICD-10-CM

## 2023-10-03 DIAGNOSIS — J342 Deviated nasal septum: Secondary | ICD-10-CM | POA: Insufficient documentation

## 2023-10-03 DIAGNOSIS — J310 Chronic rhinitis: Secondary | ICD-10-CM | POA: Insufficient documentation

## 2023-10-03 DIAGNOSIS — J343 Hypertrophy of nasal turbinates: Secondary | ICD-10-CM | POA: Insufficient documentation

## 2023-10-03 DIAGNOSIS — J3489 Other specified disorders of nose and nasal sinuses: Secondary | ICD-10-CM | POA: Insufficient documentation
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
## 2023-10-12 ENCOUNTER — Encounter (HOSPITAL_BASED_OUTPATIENT_CLINIC_OR_DEPARTMENT_OTHER): Payer: Self-pay | Admitting: Otolaryngology

## 2023-10-12 ENCOUNTER — Other Ambulatory Visit: Payer: Self-pay

## 2023-10-15 ENCOUNTER — Encounter (INDEPENDENT_AMBULATORY_CARE_PROVIDER_SITE_OTHER): Payer: No Typology Code available for payment source

## 2023-10-19 NOTE — Anesthesia Preprocedure Evaluation (Signed)
Anesthesia Evaluation  Patient identified by MRN, date of birth, ID band Patient awake    Reviewed: Allergy & Precautions, NPO status , Patient's Chart, lab work & pertinent test results  History of Anesthesia Complications (+) PONV and history of anesthetic complications  Airway Mallampati: I  TM Distance: >3 FB Neck ROM: Full    Dental  (+) Teeth Intact, Dental Advisory Given   Pulmonary Current Smoker and Patient abstained from smoking., former smoker socially   Pulmonary exam normal breath sounds clear to auscultation       Cardiovascular negative cardio ROS Normal cardiovascular exam Rhythm:Regular Rate:Normal     Neuro/Psych  Headaches PSYCHIATRIC DISORDERS  Depression       GI/Hepatic negative GI ROS, Neg liver ROS,,,  Endo/Other  Hypothyroidism    Renal/GU negative Renal ROS  negative genitourinary   Musculoskeletal negative musculoskeletal ROS (+)    Abdominal   Peds  Hematology negative hematology ROS (+)   Anesthesia Other Findings   Reproductive/Obstetrics negative OB ROS                             Anesthesia Physical Anesthesia Plan  ASA: 1  Anesthesia Plan: General   Post-op Pain Management: Tylenol PO (pre-op)* and Precedex   Induction: Intravenous  PONV Risk Score and Plan: 4 or greater and Ondansetron, Dexamethasone, Propofol infusion, TIVA, Midazolam, Scopolamine patch - Pre-op, Treatment may vary due to age or medical condition and Aprepitant  Airway Management Planned: Oral ETT  Additional Equipment: None  Intra-op Plan:   Post-operative Plan: Extubation in OR  Informed Consent: I have reviewed the patients History and Physical, chart, labs and discussed the procedure including the risks, benefits and alternatives for the proposed anesthesia with the patient or authorized representative who has indicated his/her understanding and acceptance.      Dental advisory given  Plan Discussed with: CRNA  Anesthesia Plan Comments: (Has never had an anesthetic where she did not have PONV, unable to determine if she had TIVA w/ her lap CCY at OSH in 2022)       Anesthesia Quick Evaluation

## 2023-10-22 ENCOUNTER — Ambulatory Visit (HOSPITAL_BASED_OUTPATIENT_CLINIC_OR_DEPARTMENT_OTHER)
Admission: RE | Admit: 2023-10-22 | Discharge: 2023-10-22 | Disposition: A | Discharge status: Home / Self Care | Payer: No Typology Code available for payment source | Attending: Otolaryngology | Admitting: Otolaryngology

## 2023-10-22 ENCOUNTER — Encounter (HOSPITAL_BASED_OUTPATIENT_CLINIC_OR_DEPARTMENT_OTHER)
Admission: RE | Disposition: A | Discharge status: Home / Self Care | Payer: Self-pay | Source: Home / Self Care | Attending: Otolaryngology

## 2023-10-22 ENCOUNTER — Encounter (HOSPITAL_BASED_OUTPATIENT_CLINIC_OR_DEPARTMENT_OTHER): Payer: Self-pay | Admitting: Otolaryngology

## 2023-10-22 ENCOUNTER — Ambulatory Visit (HOSPITAL_BASED_OUTPATIENT_CLINIC_OR_DEPARTMENT_OTHER): Payer: Self-pay | Admitting: Anesthesiology

## 2023-10-22 ENCOUNTER — Other Ambulatory Visit: Payer: Self-pay

## 2023-10-22 DIAGNOSIS — J343 Hypertrophy of nasal turbinates: Secondary | ICD-10-CM

## 2023-10-22 DIAGNOSIS — J342 Deviated nasal septum: Secondary | ICD-10-CM | POA: Diagnosis not present

## 2023-10-22 DIAGNOSIS — Z87891 Personal history of nicotine dependence: Secondary | ICD-10-CM | POA: Insufficient documentation

## 2023-10-22 DIAGNOSIS — J3489 Other specified disorders of nose and nasal sinuses: Secondary | ICD-10-CM | POA: Diagnosis not present

## 2023-10-22 DIAGNOSIS — E039 Hypothyroidism, unspecified: Secondary | ICD-10-CM | POA: Insufficient documentation

## 2023-10-22 DIAGNOSIS — Q819 Epidermolysis bullosa, unspecified: Secondary | ICD-10-CM | POA: Insufficient documentation

## 2023-10-22 DIAGNOSIS — Z01818 Encounter for other preprocedural examination: Secondary | ICD-10-CM

## 2023-10-22 HISTORY — PX: NASAL SEPTOPLASTY W/ TURBINOPLASTY: SHX2070

## 2023-10-22 HISTORY — DX: Depression, unspecified: F32.A

## 2023-10-22 HISTORY — PX: ENDOSCOPIC CONCHA BULLOSA RESECTION: SHX6395

## 2023-10-22 HISTORY — DX: Headache, unspecified: R51.9

## 2023-10-22 HISTORY — DX: Non-celiac gluten sensitivity: K90.41

## 2023-10-22 LAB — POCT PREGNANCY, URINE: Preg Test, Ur: NEGATIVE

## 2023-10-22 SURGERY — SEPTOPLASTY, NOSE, WITH NASAL TURBINATE REDUCTION
Anesthesia: General | Site: Nose

## 2023-10-22 MED ORDER — OXYMETAZOLINE HCL 0.05 % NA SOLN
NASAL | Status: DC | PRN
  Administered 2023-10-22: 1 via TOPICAL

## 2023-10-22 MED ORDER — DEXAMETHASONE SODIUM PHOSPHATE 10 MG/ML IJ SOLN
INTRAMUSCULAR | Status: AC
Start: 2023-10-22 — End: ?
  Filled 2023-10-22: qty 1

## 2023-10-22 MED ORDER — AMISULPRIDE (ANTIEMETIC) 5 MG/2ML IV SOLN
INTRAVENOUS | Status: AC
Start: 2023-10-22 — End: ?
  Filled 2023-10-22: qty 4

## 2023-10-22 MED ORDER — APREPITANT 40 MG PO CAPS
40.0000 mg | ORAL_CAPSULE | Freq: Once | ORAL | Status: AC
  Administered 2023-10-22: 40 mg via ORAL

## 2023-10-22 MED ORDER — CLINDAMYCIN PHOSPHATE 900 MG/50ML IV SOLN
900.0000 mg | Freq: Once | INTRAVENOUS | Status: AC
  Administered 2023-10-22: 900 mg via INTRAVENOUS

## 2023-10-22 MED ORDER — CLINDAMYCIN PHOSPHATE 900 MG/50ML IV SOLN
INTRAVENOUS | Status: AC
  Filled 2023-10-22: qty 50

## 2023-10-22 MED ORDER — OXYCODONE HCL 5 MG PO TABS
ORAL_TABLET | ORAL | Status: AC
  Filled 2023-10-22: qty 1

## 2023-10-22 MED ORDER — SCOPOLAMINE 1 MG/3DAYS TD PT72
1.0000 | MEDICATED_PATCH | TRANSDERMAL | Status: DC
  Administered 2023-10-22: 1.5 mg via TRANSDERMAL

## 2023-10-22 MED ORDER — DEXMEDETOMIDINE HCL IN NACL 80 MCG/20ML IV SOLN
INTRAVENOUS | Status: AC
  Filled 2023-10-22: qty 20

## 2023-10-22 MED ORDER — DEXAMETHASONE SODIUM PHOSPHATE 10 MG/ML IJ SOLN
INTRAMUSCULAR | Status: DC | PRN
  Administered 2023-10-22: 10 mg via INTRAVENOUS

## 2023-10-22 MED ORDER — PROPOFOL 500 MG/50ML IV EMUL
INTRAVENOUS | Status: DC | PRN
  Administered 2023-10-22: 150 ug/kg/min via INTRAVENOUS
  Administered 2023-10-22: 175 ug/kg/min via INTRAVENOUS

## 2023-10-22 MED ORDER — OXYCODONE HCL 5 MG PO TABS
5.0000 mg | ORAL_TABLET | Freq: Once | ORAL | Status: AC | PRN
  Administered 2023-10-22: 5 mg via ORAL

## 2023-10-22 MED ORDER — AMISULPRIDE (ANTIEMETIC) 5 MG/2ML IV SOLN
10.0000 mg | Freq: Once | INTRAVENOUS | Status: AC | PRN
  Administered 2023-10-22: 10 mg via INTRAVENOUS

## 2023-10-22 MED ORDER — HYDROMORPHONE HCL 1 MG/ML IJ SOLN
0.2500 mg | INTRAMUSCULAR | Status: DC | PRN
Start: 2023-10-22 — End: 2023-10-22

## 2023-10-22 MED ORDER — ONDANSETRON 4 MG PO TBDP
4.0000 mg | ORAL_TABLET | Freq: Once | ORAL | Status: AC
  Administered 2023-10-22: 4 mg via ORAL

## 2023-10-22 MED ORDER — LIDOCAINE-EPINEPHRINE 1 %-1:100000 IJ SOLN
INTRAMUSCULAR | Status: DC | PRN
  Administered 2023-10-22: 5 mL

## 2023-10-22 MED ORDER — MIDAZOLAM HCL 5 MG/5ML IJ SOLN
INTRAMUSCULAR | Status: DC | PRN
  Administered 2023-10-22: 2 mg via INTRAVENOUS

## 2023-10-22 MED ORDER — ONDANSETRON HCL 4 MG/2ML IJ SOLN
INTRAMUSCULAR | Status: AC
Start: 2023-10-22 — End: ?
  Filled 2023-10-22: qty 2

## 2023-10-22 MED ORDER — FENTANYL CITRATE (PF) 100 MCG/2ML IJ SOLN
INTRAMUSCULAR | Status: DC | PRN
  Administered 2023-10-22 (×2): 50 ug via INTRAVENOUS

## 2023-10-22 MED ORDER — OXYCODONE HCL 5 MG/5ML PO SOLN: 5.0000 mg | Freq: Once | ORAL | Status: AC | PRN

## 2023-10-22 MED ORDER — SCOPOLAMINE 1 MG/3DAYS TD PT72
MEDICATED_PATCH | TRANSDERMAL | Status: AC
  Filled 2023-10-22: qty 1

## 2023-10-22 MED ORDER — MEPERIDINE HCL 25 MG/ML IJ SOLN
6.2500 mg | INTRAMUSCULAR | Status: DC | PRN
Start: 2023-10-22 — End: 2023-10-22

## 2023-10-22 MED ORDER — PROPOFOL 500 MG/50ML IV EMUL
INTRAVENOUS | Status: AC
Start: 2023-10-22 — End: ?
  Filled 2023-10-22: qty 50

## 2023-10-22 MED ORDER — ACETAMINOPHEN 500 MG PO TABS
1000.0000 mg | ORAL_TABLET | Freq: Once | ORAL | Status: AC
  Administered 2023-10-22: 1000 mg via ORAL

## 2023-10-22 MED ORDER — ONDANSETRON 4 MG PO TBDP
ORAL_TABLET | ORAL | Status: AC
Start: 2023-10-22 — End: ?
  Filled 2023-10-22: qty 1

## 2023-10-22 MED ORDER — ROCURONIUM BROMIDE 100 MG/10ML IV SOLN
INTRAVENOUS | Status: DC | PRN
  Administered 2023-10-22: 60 mg via INTRAVENOUS

## 2023-10-22 MED ORDER — ONDANSETRON HCL 4 MG/2ML IJ SOLN
INTRAMUSCULAR | Status: DC | PRN
  Administered 2023-10-22: 4 mg via INTRAVENOUS

## 2023-10-22 MED ORDER — PROPOFOL 1000 MG/100ML IV EMUL
INTRAVENOUS | Status: AC
Start: 2023-10-22 — End: ?
  Filled 2023-10-22: qty 200

## 2023-10-22 MED ORDER — PROPOFOL 10 MG/ML IV BOLUS
INTRAVENOUS | Status: AC
  Filled 2023-10-22: qty 20

## 2023-10-22 MED ORDER — MUPIROCIN 2 % EX OINT
TOPICAL_OINTMENT | CUTANEOUS | Status: DC | PRN
  Administered 2023-10-22: 1 via NASAL

## 2023-10-22 MED ORDER — ACETAMINOPHEN 500 MG PO TABS
ORAL_TABLET | ORAL | Status: AC
  Filled 2023-10-22: qty 2

## 2023-10-22 MED ORDER — ROCURONIUM BROMIDE 10 MG/ML (PF) SYRINGE
PREFILLED_SYRINGE | INTRAVENOUS | Status: AC
  Filled 2023-10-22: qty 10

## 2023-10-22 MED ORDER — FENTANYL CITRATE (PF) 100 MCG/2ML IJ SOLN
INTRAMUSCULAR | Status: AC
  Filled 2023-10-22: qty 2

## 2023-10-22 MED ORDER — PROPOFOL 10 MG/ML IV BOLUS
INTRAVENOUS | Status: DC | PRN
  Administered 2023-10-22: 200 mg via INTRAVENOUS

## 2023-10-22 MED ORDER — SUGAMMADEX SODIUM 200 MG/2ML IV SOLN
INTRAVENOUS | Status: DC | PRN
  Administered 2023-10-22: 200 mg via INTRAVENOUS

## 2023-10-22 MED ORDER — LACTATED RINGERS IV SOLN: INTRAVENOUS | Status: DC

## 2023-10-22 MED ORDER — APREPITANT 40 MG PO CAPS
ORAL_CAPSULE | ORAL | Status: AC
Start: 2023-10-22 — End: ?
  Filled 2023-10-22: qty 1

## 2023-10-22 MED ORDER — MIDAZOLAM HCL 2 MG/2ML IJ SOLN
INTRAMUSCULAR | Status: AC
Start: 2023-10-22 — End: ?
  Filled 2023-10-22: qty 2

## 2023-10-22 MED ORDER — ONDANSETRON HCL 4 MG/2ML IJ SOLN
4.0000 mg | Freq: Once | INTRAMUSCULAR | Status: DC | PRN
Start: 2023-10-22 — End: 2023-10-22

## 2023-10-22 MED ORDER — LIDOCAINE 2% (20 MG/ML) 5 ML SYRINGE
INTRAMUSCULAR | Status: AC
  Filled 2023-10-22: qty 5

## 2023-10-22 MED ORDER — LIDOCAINE 2% (20 MG/ML) 5 ML SYRINGE
INTRAMUSCULAR | Status: DC | PRN
  Administered 2023-10-22: 60 mg via INTRAVENOUS

## 2023-10-22 SURGICAL SUPPLY — 42 items
ATTRACTOMAT 16X20 MAGNETIC DRP (DRAPES) IMPLANT
BLADE TRICUT ROTATE M4 4 5PK (BLADE) IMPLANT
CANISTER SUC SOCK COL 7IN (MISCELLANEOUS) IMPLANT
CANISTER SUCT 1200ML W/VALVE (MISCELLANEOUS) ×2 IMPLANT
COAGULATOR SUCT 8FR VV (MISCELLANEOUS) ×2 IMPLANT
DEFOGGER MIRROR 1QT (MISCELLANEOUS) ×4 IMPLANT
DRSG NASOPORE 8CM (GAUZE/BANDAGES/DRESSINGS) IMPLANT
DRSG TELFA 3X8 NADH STRL (GAUZE/BANDAGES/DRESSINGS) IMPLANT
ELECT REM PT RETURN 9FT ADLT (ELECTROSURGICAL) ×2
ELECTRODE REM PT RTRN 9FT ADLT (ELECTROSURGICAL) ×2 IMPLANT
GAUZE SPONGE 2X2 STRL 8-PLY (GAUZE/BANDAGES/DRESSINGS) ×2 IMPLANT
GLOVE BIO SURGEON STRL SZ7.5 (GLOVE) ×2 IMPLANT
GOWN STRL REUS W/ TWL LRG LVL3 (GOWN DISPOSABLE) ×4 IMPLANT
GOWN STRL REUS W/TWL LRG LVL3 (GOWN DISPOSABLE) ×2
HEMOSTAT SURGICEL 2X14 (HEMOSTASIS) IMPLANT
IV NS 500ML (IV SOLUTION)
IV NS 500ML BAXH (IV SOLUTION) IMPLANT
IV SET EXT 30 76VOL 4 MALE LL (IV SETS) IMPLANT
NDL HYPO 25X1 1.5 SAFETY (NEEDLE) ×2 IMPLANT
NDL SPNL 25GX3.5 QUINCKE BL (NEEDLE) IMPLANT
NEEDLE HYPO 25X1 1.5 SAFETY (NEEDLE) ×2
NEEDLE SPNL 25GX3.5 QUINCKE BL (NEEDLE)
NS IRRIG 1000ML POUR BTL (IV SOLUTION) ×2 IMPLANT
PACK BASIN DAY SURGERY FS (CUSTOM PROCEDURE TRAY) ×2 IMPLANT
PACK ENT DAY SURGERY (CUSTOM PROCEDURE TRAY) ×2 IMPLANT
SLEEVE SCD COMPRESS KNEE MED (STOCKING) IMPLANT
SPIKE FLUID TRANSFER (MISCELLANEOUS) IMPLANT
SPLINT NASAL AIRWAY SILICONE (MISCELLANEOUS) ×2 IMPLANT
SPONGE NEURO XRAY DETECT 1X3 (DISPOSABLE) ×2 IMPLANT
SUCTION TUBE FRAZIER 10FR DISP (SUCTIONS) IMPLANT
SUCTION TUBE FRAZIER 12FR DISP (SUCTIONS) IMPLANT
SUT CHROMIC 4 0 P 3 18 (SUTURE) ×2 IMPLANT
SUT PLAIN 4 0 ~~LOC~~ 1 (SUTURE) ×2 IMPLANT
SUT PROLENE 3 0 PS 2 (SUTURE) ×2 IMPLANT
SUT VIC AB 4-0 P-3 18XBRD (SUTURE) IMPLANT
SUT VIC AB 4-0 P3 18 (SUTURE)
SYR 50ML LL SCALE MARK (SYRINGE) IMPLANT
TOWEL GREEN STERILE FF (TOWEL DISPOSABLE) ×2 IMPLANT
TUBE CONNECTING 20X1/4 (TUBING) IMPLANT
TUBE SALEM SUMP 12FR 48 (TUBING) IMPLANT
TUBE SALEM SUMP 16F (TUBING) ×2 IMPLANT
YANKAUER SUCT BULB TIP NO VENT (SUCTIONS) ×2 IMPLANT

## 2023-10-22 NOTE — Transfer of Care (Signed)
Immediate Anesthesia Transfer of Care Note  Patient: Paula French  Procedure(s) Performed: NASAL SEPTOPLASTY WITH TURBINATE REDUCTION (Nose) ENDOSCOPIC CONCHA BULLOSA RESECTION (Left: Nose)  Patient Location: PACU  Anesthesia Type:General  Level of Consciousness: drowsy  Airway & Oxygen Therapy: Patient Spontanous Breathing and Patient connected to face mask oxygen  Post-op Assessment: Report given to RN and Post -op Vital signs reviewed and stable  Post vital signs: Reviewed and stable  Last Vitals:  Vitals Value Taken Time  BP 117/77 10/22/23 1035  Temp 36.6 C 10/22/23 1035  Pulse 72 10/22/23 1040  Resp 13 10/22/23 1040  SpO2 100 % 10/22/23 1040  Vitals shown include unfiled device data.  Last Pain:  Vitals:   10/22/23 1035  TempSrc:   PainSc: Asleep         Complications: No notable events documented.

## 2023-10-22 NOTE — Anesthesia Postprocedure Evaluation (Signed)
Anesthesia Post Note  Patient: Paula French  Procedure(s) Performed: NASAL SEPTOPLASTY WITH TURBINATE REDUCTION (Nose) ENDOSCOPIC CONCHA BULLOSA RESECTION (Left: Nose)     Patient location during evaluation: PACU Anesthesia Type: General Level of consciousness: awake and alert, oriented and patient cooperative Pain management: pain level controlled Vital Signs Assessment: post-procedure vital signs reviewed and stable Respiratory status: spontaneous breathing, nonlabored ventilation and respiratory function stable Cardiovascular status: blood pressure returned to baseline and stable Postop Assessment: no apparent nausea or vomiting Anesthetic complications: no Comments: Only slight nausea after eating in phase 2, given zofran ODT as PIV already removed   No notable events documented.  Last Vitals:  Vitals:   10/22/23 1100 10/22/23 1145  BP: 124/80 111/82  Pulse: 60   Resp: 13 16  Temp:  36.6 C  SpO2: 100% 96%    Last Pain:  Vitals:   10/22/23 1145  TempSrc:   PainSc: 5                  Lannie Fields

## 2023-10-22 NOTE — Op Note (Signed)
DATE OF PROCEDURE: 10/22/2023  OPERATIVE REPORT   SURGEON: Newman Pies, MD   PREOPERATIVE DIAGNOSES:  1.  Severe nasal septal deviation.  2.  Bilateral inferior turbinate hypertrophy.  3.  Left concha bullosa.   4.  Chronic nasal obstruction.  POSTOPERATIVE DIAGNOSES:  1.  Severe nasal septal deviation.  2.  Bilateral inferior turbinate hypertrophy.  3.  Left concha bullosa.   4.  Chronic nasal obstruction.  PROCEDURE PERFORMED:  1.  Septoplasty.  2.  Bilateral partial inferior turbinate resection.  3.  Endoscopic left concha bullosa resection.  ANESTHESIA: General endotracheal tube anesthesia.   COMPLICATIONS: None.   ESTIMATED BLOOD LOSS: 250 mL.   INDICATION FOR PROCEDURE: Paula French is a 37 y.o. female with a history of chronic nasal obstruction. The patient was treated with antihistamine, decongestant, and steroid nasal sprays. However, the patient continued to be symptomatic. On examination, the patient was noted to have bilateral severe inferior turbinate hypertrophy and significant nasal septal deviation, causing significant nasal obstruction.  Her MRI scan also showed a large left concha bullosa.  Based on the above findings, the decision was made for the patient to undergo the above-stated procedures. The risks, benefits, alternatives, and details of the procedures were discussed with the patient. Questions were invited and answered. Informed consent was obtained.   DESCRIPTION OF PROCEDURE: The patient was taken to the operating room and placed supine on the operating table. General endotracheal tube anesthesia was administered by the anesthesiologist. The patient was positioned, and prepped and draped in the standard fashion for nasal surgery. Pledgets soaked with Afrin were placed in both nasal cavities for decongestion. The pledgets were subsequently removed. The FUSION stereotactic image guidance marker was placed. The image guidance system was functional throughout the  case.  Examination of the nasal cavity revealed a severe nasal septal deviation. 1% lidocaine with 1:100,000 epinephrine was injected onto the nasal septum bilaterally. A hemitransfixion incision was made on the left side. The mucosal flap was carefully elevated on the left side. A cartilaginous incision was made 1 cm superior to the caudal margin of the nasal septum. Mucosal flap was also elevated on the right side in the similar fashion. It should be noted that due to the severe septal deviation, the deviated portion of the cartilaginous and bony septum had to be removed in piecemeal fashion. Once the deviated portions were removed, a straight midline septum was achieved. The septum was then quilted with 4-0 plain gut sutures. The hemitransfixion incision was closed with interrupted 4-0 chromic sutures.   The inferior one half of both hypertrophied inferior turbinate was crossclamped with a Kelly clamp. The inferior one half of each inferior turbinate was then resected with a pair of cross cutting scissors. Hemostasis was achieved with a suction cautery device.   Using a 0 endoscope, the left nasal cavity was examined. A large concha bullosa was noted. Using Tru-Cut forceps, the inferior one third and medial one half of the concha bullosa was resected.  Hemostasis was achieved with a suction cautery device.  Doyle splints were applied to the nasal septum.  The care of the patient was turned over to the anesthesiologist. The patient was awakened from anesthesia without difficulty. The patient was extubated and transferred to the recovery room in good condition.   OPERATIVE FINDINGS: Severe nasal septal deviation and bilateral inferior turbinate hypertrophy.  A large left concha bullosa.  SPECIMEN: None.   FOLLOWUP CARE: The patient be discharged home once she is awake and  alert.  The patient will follow up in my office in 3 days for splint removal.   Nalleli Largent Philomena Doheny, MD

## 2023-10-22 NOTE — Anesthesia Procedure Notes (Signed)
Procedure Name: Intubation Date/Time: 10/22/2023 9:19 AM  Performed by: Lauralyn Primes, CRNAPre-anesthesia Checklist: Patient identified, Emergency Drugs available, Suction available and Patient being monitored Patient Re-evaluated:Patient Re-evaluated prior to induction Oxygen Delivery Method: Circle system utilized Preoxygenation: Pre-oxygenation with 100% oxygen Induction Type: IV induction Ventilation: Mask ventilation without difficulty Laryngoscope Size: Mac and 3 Grade View: Grade I Tube type: Oral Tube size: 7.0 mm Number of attempts: 1 Airway Equipment and Method: Stylet and Bite block Placement Confirmation: ETT inserted through vocal cords under direct vision, positive ETCO2 and breath sounds checked- equal and bilateral Secured at: 22 cm Tube secured with: Tape Dental Injury: Teeth and Oropharynx as per pre-operative assessment

## 2023-10-22 NOTE — Discharge Instructions (Addendum)
Post Anesthesia Home Care Instructions  Activity: Get plenty of rest for the remainder of the day. A responsible individual must stay with you for 24 hours following the procedure.  For the next 24 hours, DO NOT: -Drive a car -Advertising copywriter -Drink alcoholic beverages -Take any medication unless instructed by your physician -Make any legal decisions or sign important papers.  Meals: Start with liquid foods such as gelatin or soup. Progress to regular foods as tolerated. Avoid greasy, spicy, heavy foods. If nausea and/or vomiting occur, drink only clear liquids until the nausea and/or vomiting subsides. Call your physician if vomiting continues.  Special Instructions/Symptoms: Your throat may feel dry or sore from the anesthesia or the breathing tube placed in your throat during surgery. If this causes discomfort, gargle with warm salt water. The discomfort should disappear within 24 hours.  If you had a scopolamine patch placed behind your ear for the management of post- operative nausea and/or vomiting:  1. The medication in the patch is effective for 72 hours, after which it should be removed.  Wrap patch in a tissue and discard in the trash. Wash hands thoroughly with soap and water. 2. You may remove the patch earlier than 72 hours if you experience unpleasant side effects which may include dry mouth, dizziness or visual disturbances. 3. Avoid touching the patch. Wash your hands with soap and water after contact with the patch.    Next dose of tylenol may be taken at 2:30p   POSTOPERATIVE INSTRUCTIONS FOR PATIENTS HAVING NASAL OR SINUS OPERATIONS ACTIVITY: Restrict activity at home for the first two days, resting as much as possible. Light activity is best. You may usually return to work within a week. You should refrain from nose blowing, strenuous activity, or heavy lifting greater than 20lbs for a total of one week after your operation.  If sneezing cannot be avoided, sneeze  with your mouth open. DISCOMFORT: You may experience a dull headache and pressure along with nasal congestion and discharge. These symptoms may be worse during the first week after the operation but may last as long as two to four weeks.  Please take Tylenol or the pain medication that has been prescribed for you. Do not take aspirin or aspirin containing medications since they may cause bleeding.  You may experience symptoms of post nasal drainage, nasal congestion, headaches and fatigue for two or three months after your operation.  BLEEDING: You may have some blood tinged nasal drainage for approximately two weeks after the operation.  The discharge will be worse for the first week.  Please call our office at [phone] or go to the nearest hospital emergency room if you experience any of the following: heavy, bright red blood from your nose or mouth that lasts longer than 15 minutes or coughing up or vomiting bright red blood or blood clots. GENERAL CONSIDERATIONS: A gauze dressing will be placed on your upper lip to absorb any drainage after the operation. You may need to change this several times a day.  If you do not have very much drainage, you may remove the dressing.  Remember that you may gently wipe your nose with a tissue and sniff in, but DO NOT blow your nose. Please keep all of your postoperative appointments.  Your final results after the operation will depend on proper follow-up.  The initial visit is usually 2 to 5 days after the operation.  During this visit, the remaining nasal packing and internal septal splints will be removed.  Your nasal and sinus cavities will be cleaned.  During the second visit, your nasal and sinus cavities will be cleaned again. Have someone drive you to your first two postoperative appointments.  How you care for your nose after the operation will influence the results that you obtain.  You should follow all directions, take your medication as prescribed, and  call our office [phone] with any problems or questions. You may be more comfortable sleeping with your head elevated on two pillows. Do not take any medications that we have not prescribed or recommended. WARNING SIGNS: if any of the following should occur, please call our office: Persistent fever greater than 102F. Persistent vomiting. Severe and constant pain that is not relieved by prescribed pain medication. Trauma to the nose. Rash or unusual side effects from any medicines.
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
## 2023-10-23 ENCOUNTER — Encounter (HOSPITAL_BASED_OUTPATIENT_CLINIC_OR_DEPARTMENT_OTHER): Payer: Self-pay | Admitting: Otolaryngology

## 2023-10-25 ENCOUNTER — Encounter (INDEPENDENT_AMBULATORY_CARE_PROVIDER_SITE_OTHER): Payer: Self-pay

## 2023-10-25 ENCOUNTER — Ambulatory Visit (INDEPENDENT_AMBULATORY_CARE_PROVIDER_SITE_OTHER): Payer: No Typology Code available for payment source | Admitting: Otolaryngology

## 2023-10-25 VITALS — Ht 63.0 in | Wt 125.0 lb

## 2023-10-25 DIAGNOSIS — J343 Hypertrophy of nasal turbinates: Secondary | ICD-10-CM

## 2023-10-25 DIAGNOSIS — J310 Chronic rhinitis: Secondary | ICD-10-CM

## 2023-10-25 DIAGNOSIS — J342 Deviated nasal septum: Secondary | ICD-10-CM

## 2023-10-25 DIAGNOSIS — Z9889 Other specified postprocedural states: Secondary | ICD-10-CM

## 2023-10-25 NOTE — Progress Notes (Signed)
Patient ID: Paula French, female   DOB: 22-Aug-1986, 37 y.o.   MRN: [id number]  Ralph Leyden splints removed. Septum and turbinates are healing well.  Both [place] debrided.  Nasal saline irrigation.  Recheck in 3 weeks.

## 2023-11-15 ENCOUNTER — Ambulatory Visit (INDEPENDENT_AMBULATORY_CARE_PROVIDER_SITE_OTHER): Payer: No Typology Code available for payment source | Admitting: Otolaryngology

## 2023-11-15 ENCOUNTER — Encounter (INDEPENDENT_AMBULATORY_CARE_PROVIDER_SITE_OTHER): Payer: Self-pay

## 2023-11-15 VITALS — Ht 63.0 in | Wt 120.0 lb

## 2023-11-15 DIAGNOSIS — J310 Chronic rhinitis: Secondary | ICD-10-CM

## 2023-11-15 NOTE — Progress Notes (Signed)
Patient ID: Paula French, female   DOB: 10/26/1986, 37 y.o.   MRN: [id number]  The patient reports significant improvement in her nasal breathing and sleeping.  Septum and turbinates are healing well.   Both [place] debrided.   Nasal saline irrigation as needed.   Recheck in 6 months.

## 2023-12-17 ENCOUNTER — Ambulatory Visit (INDEPENDENT_AMBULATORY_CARE_PROVIDER_SITE_OTHER): Payer: Medicaid Other | Admitting: Neurology

## 2023-12-17 ENCOUNTER — Encounter: Payer: Self-pay | Admitting: Neurology

## 2023-12-17 VITALS — BP 140/96 | Resp 15 | Ht 63.0 in | Wt 137.0 lb

## 2023-12-17 DIAGNOSIS — G44329 Chronic post-traumatic headache, not intractable: Secondary | ICD-10-CM

## 2023-12-17 DIAGNOSIS — R519 Headache, unspecified: Secondary | ICD-10-CM | POA: Insufficient documentation
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
## 2024-05-08 ENCOUNTER — Ambulatory Visit (INDEPENDENT_AMBULATORY_CARE_PROVIDER_SITE_OTHER): Payer: No Typology Code available for payment source | Admitting: Otolaryngology

## 2024-06-24 ENCOUNTER — Encounter (INDEPENDENT_AMBULATORY_CARE_PROVIDER_SITE_OTHER): Payer: Self-pay | Admitting: Otolaryngology

## 2024-06-24 ENCOUNTER — Ambulatory Visit (INDEPENDENT_AMBULATORY_CARE_PROVIDER_SITE_OTHER): Admitting: Otolaryngology

## 2024-06-24 VITALS — BP 112/73 | HR 82

## 2024-06-24 DIAGNOSIS — J343 Hypertrophy of nasal turbinates: Secondary | ICD-10-CM

## 2024-06-24 DIAGNOSIS — J310 Chronic rhinitis: Secondary | ICD-10-CM

## 2024-06-24 DIAGNOSIS — R0981 Nasal congestion: Secondary | ICD-10-CM | POA: Diagnosis not present

## 2024-06-25 NOTE — Progress Notes (Signed)
 Patient ID: Paula French, female   DOB: Nov 21, 1986, 38 y.o.   MRN: [id number]  Follow-up: Chronic nasal obstruction  HPI: The patient is a 38 year old female who returns today for her follow-up evaluation.  She was previously seen for chronic nasal obstruction.  The patient was previously noted to have nasal septal deviation, bilateral inferior turbinate hypertrophy, and left concha bullosa.  She underwent septoplasty, bilateral turbinate reduction, and left concha bullosa resection in November 2024.  The patient returns today reporting significant improvement in her nasal breathing.  She still has occasional tenderness at the surgical site.  She denies any facial pain, fever, or visual change.  Exam: General: Communicates without difficulty, well nourished, no acute distress. Head: Normocephalic, no evidence injury, no tenderness, facial buttresses intact without stepoff. Face/sinus: No tenderness to palpation and percussion. Facial movement is normal and symmetric. Eyes: PERRL, EOMI. No scleral icterus, conjunctivae clear. Neuro: CN II exam reveals vision grossly intact.  No nystagmus at any point of gaze. Ears: Auricles well formed without lesions.  Ear canals are intact without mass or lesion.  No erythema or edema is appreciated.  The TMs are intact without fluid. Nose: External evaluation reveals normal support and skin without lesions.  Dorsum is intact.  Anterior rhinoscopy reveals congested mucosa over anterior aspect of inferior turbinates and intact septum.  No purulence noted. Oral:  Oral cavity and oropharynx are intact, symmetric, without erythema or edema.  Mucosa is moist without lesions. Neck: Full range of motion without pain.  There is no significant lymphadenopathy.  No masses palpable.  Thyroid  bed within normal limits to palpation.  Parotid glands and submandibular glands equal bilaterally without mass.  Trachea is midline. Neuro:  CN 2-12 grossly intact.   Assessment: 1.  Chronic  rhinitis with mild nasal mucosal congestion. 2.  Her septum and turbinates are well-healed.  Her nasal passageways are patent bilaterally.  Plan: 1.  The physical exam findings are reviewed with the patient. 2.  Nasal saline irrigation as needed. 3.  The patient is encouraged to call with any questions or concerns.
